# Patient Record
Sex: Male | Born: 1970
Health system: Southern US, Community
[De-identification: ages and names within clinical notes are randomized; demographics above are authoritative.]

## PROBLEM LIST (undated history)

## (undated) DIAGNOSIS — Z950 Presence of cardiac pacemaker: Secondary | ICD-10-CM

## (undated) DIAGNOSIS — I428 Other cardiomyopathies: Secondary | ICD-10-CM

## (undated) DIAGNOSIS — E785 Hyperlipidemia, unspecified: Secondary | ICD-10-CM

## (undated) DIAGNOSIS — K429 Umbilical hernia without obstruction or gangrene: Secondary | ICD-10-CM

## (undated) DIAGNOSIS — K922 Gastrointestinal hemorrhage, unspecified: Secondary | ICD-10-CM

## (undated) DIAGNOSIS — I10 Essential (primary) hypertension: Secondary | ICD-10-CM

## (undated) DIAGNOSIS — Z95810 Presence of automatic (implantable) cardiac defibrillator: Secondary | ICD-10-CM

## (undated) DIAGNOSIS — E119 Type 2 diabetes mellitus without complications: Secondary | ICD-10-CM

## (undated) HISTORY — PX: INSERT / REPLACE / REMOVE PACEMAKER: SUR710

## (undated) HISTORY — PX: CARDIAC SURGERY: SHX584

---

## 2008-02-08 ENCOUNTER — Inpatient Hospital Stay: Payer: Self-pay | Admitting: Internal Medicine

## 2008-02-08 ENCOUNTER — Other Ambulatory Visit: Payer: Self-pay

## 2008-02-23 ENCOUNTER — Ambulatory Visit: Payer: Self-pay | Admitting: Family

## 2008-03-22 ENCOUNTER — Ambulatory Visit: Payer: Self-pay | Admitting: Family

## 2008-04-25 ENCOUNTER — Ambulatory Visit: Payer: Self-pay | Admitting: Family

## 2008-06-28 ENCOUNTER — Ambulatory Visit: Payer: Self-pay | Admitting: Family

## 2008-11-09 ENCOUNTER — Ambulatory Visit: Payer: Self-pay | Admitting: Family

## 2009-03-15 ENCOUNTER — Ambulatory Visit: Payer: Self-pay | Admitting: Family

## 2009-06-28 ENCOUNTER — Ambulatory Visit: Payer: Self-pay | Admitting: Family

## 2009-10-04 ENCOUNTER — Ambulatory Visit: Payer: Self-pay | Admitting: Family

## 2011-09-03 DIAGNOSIS — I447 Left bundle-branch block, unspecified: Secondary | ICD-10-CM | POA: Insufficient documentation

## 2011-09-03 DIAGNOSIS — Z9289 Personal history of other medical treatment: Secondary | ICD-10-CM | POA: Insufficient documentation

## 2011-09-03 DIAGNOSIS — I420 Dilated cardiomyopathy: Secondary | ICD-10-CM | POA: Insufficient documentation

## 2012-03-03 DIAGNOSIS — Z950 Presence of cardiac pacemaker: Secondary | ICD-10-CM | POA: Insufficient documentation

## 2012-12-23 LAB — HEMOGLOBIN A1C: HEMOGLOBIN A1C: 6.9

## 2014-07-19 ENCOUNTER — Ambulatory Visit: Payer: Self-pay | Admitting: Family Medicine

## 2014-12-20 DIAGNOSIS — Z950 Presence of cardiac pacemaker: Secondary | ICD-10-CM | POA: Diagnosis not present

## 2015-02-07 DIAGNOSIS — E669 Obesity, unspecified: Secondary | ICD-10-CM | POA: Diagnosis not present

## 2015-02-07 DIAGNOSIS — I10 Essential (primary) hypertension: Secondary | ICD-10-CM | POA: Diagnosis not present

## 2015-02-07 DIAGNOSIS — I509 Heart failure, unspecified: Secondary | ICD-10-CM | POA: Diagnosis not present

## 2015-02-07 DIAGNOSIS — I429 Cardiomyopathy, unspecified: Secondary | ICD-10-CM | POA: Diagnosis not present

## 2015-02-23 DIAGNOSIS — I429 Cardiomyopathy, unspecified: Secondary | ICD-10-CM | POA: Diagnosis not present

## 2015-02-23 DIAGNOSIS — I509 Heart failure, unspecified: Secondary | ICD-10-CM | POA: Diagnosis not present

## 2015-08-28 DIAGNOSIS — I429 Cardiomyopathy, unspecified: Secondary | ICD-10-CM | POA: Diagnosis not present

## 2015-08-28 DIAGNOSIS — R0602 Shortness of breath: Secondary | ICD-10-CM | POA: Diagnosis not present

## 2015-08-28 DIAGNOSIS — I10 Essential (primary) hypertension: Secondary | ICD-10-CM | POA: Diagnosis not present

## 2015-08-28 DIAGNOSIS — E669 Obesity, unspecified: Secondary | ICD-10-CM | POA: Diagnosis not present

## 2015-08-28 DIAGNOSIS — R9431 Abnormal electrocardiogram [ECG] [EKG]: Secondary | ICD-10-CM | POA: Diagnosis not present

## 2015-08-28 DIAGNOSIS — Z950 Presence of cardiac pacemaker: Secondary | ICD-10-CM | POA: Diagnosis not present

## 2015-08-28 DIAGNOSIS — I5022 Chronic systolic (congestive) heart failure: Secondary | ICD-10-CM | POA: Diagnosis not present

## 2015-08-28 DIAGNOSIS — R42 Dizziness and giddiness: Secondary | ICD-10-CM | POA: Diagnosis not present

## 2016-02-26 DIAGNOSIS — R9431 Abnormal electrocardiogram [ECG] [EKG]: Secondary | ICD-10-CM | POA: Diagnosis not present

## 2016-02-26 DIAGNOSIS — I10 Essential (primary) hypertension: Secondary | ICD-10-CM | POA: Diagnosis not present

## 2016-02-26 DIAGNOSIS — I472 Ventricular tachycardia: Secondary | ICD-10-CM | POA: Diagnosis not present

## 2016-02-26 DIAGNOSIS — I509 Heart failure, unspecified: Secondary | ICD-10-CM | POA: Diagnosis not present

## 2016-02-26 DIAGNOSIS — I429 Cardiomyopathy, unspecified: Secondary | ICD-10-CM | POA: Diagnosis not present

## 2016-02-26 DIAGNOSIS — E669 Obesity, unspecified: Secondary | ICD-10-CM | POA: Diagnosis not present

## 2016-02-26 DIAGNOSIS — R0602 Shortness of breath: Secondary | ICD-10-CM | POA: Diagnosis not present

## 2016-02-26 DIAGNOSIS — I5022 Chronic systolic (congestive) heart failure: Secondary | ICD-10-CM | POA: Diagnosis not present

## 2016-02-26 DIAGNOSIS — R42 Dizziness and giddiness: Secondary | ICD-10-CM | POA: Diagnosis not present

## 2016-05-16 DIAGNOSIS — I429 Cardiomyopathy, unspecified: Secondary | ICD-10-CM | POA: Insufficient documentation

## 2016-05-16 DIAGNOSIS — E785 Hyperlipidemia, unspecified: Secondary | ICD-10-CM | POA: Insufficient documentation

## 2016-05-16 DIAGNOSIS — I10 Essential (primary) hypertension: Secondary | ICD-10-CM | POA: Insufficient documentation

## 2016-05-16 DIAGNOSIS — M109 Gout, unspecified: Secondary | ICD-10-CM | POA: Insufficient documentation

## 2016-05-16 DIAGNOSIS — E119 Type 2 diabetes mellitus without complications: Secondary | ICD-10-CM | POA: Insufficient documentation

## 2016-05-16 DIAGNOSIS — E669 Obesity, unspecified: Secondary | ICD-10-CM | POA: Insufficient documentation

## 2016-08-14 DIAGNOSIS — I5022 Chronic systolic (congestive) heart failure: Secondary | ICD-10-CM | POA: Diagnosis not present

## 2016-08-14 DIAGNOSIS — I10 Essential (primary) hypertension: Secondary | ICD-10-CM | POA: Diagnosis not present

## 2016-08-14 DIAGNOSIS — R42 Dizziness and giddiness: Secondary | ICD-10-CM | POA: Diagnosis not present

## 2016-08-14 DIAGNOSIS — R9431 Abnormal electrocardiogram [ECG] [EKG]: Secondary | ICD-10-CM | POA: Diagnosis not present

## 2016-08-14 DIAGNOSIS — I429 Cardiomyopathy, unspecified: Secondary | ICD-10-CM | POA: Diagnosis not present

## 2016-08-14 DIAGNOSIS — R0602 Shortness of breath: Secondary | ICD-10-CM | POA: Diagnosis not present

## 2016-08-14 DIAGNOSIS — I472 Ventricular tachycardia: Secondary | ICD-10-CM | POA: Diagnosis not present

## 2016-08-14 DIAGNOSIS — T82110A Breakdown (mechanical) of cardiac electrode, initial encounter: Secondary | ICD-10-CM | POA: Diagnosis not present

## 2016-08-14 DIAGNOSIS — E669 Obesity, unspecified: Secondary | ICD-10-CM | POA: Diagnosis not present

## 2016-08-15 DIAGNOSIS — I5022 Chronic systolic (congestive) heart failure: Secondary | ICD-10-CM | POA: Diagnosis not present

## 2016-11-26 DIAGNOSIS — I472 Ventricular tachycardia: Secondary | ICD-10-CM | POA: Diagnosis not present

## 2017-02-10 DIAGNOSIS — Z950 Presence of cardiac pacemaker: Secondary | ICD-10-CM | POA: Diagnosis not present

## 2017-02-10 DIAGNOSIS — E669 Obesity, unspecified: Secondary | ICD-10-CM | POA: Diagnosis not present

## 2017-02-10 DIAGNOSIS — R9431 Abnormal electrocardiogram [ECG] [EKG]: Secondary | ICD-10-CM | POA: Diagnosis not present

## 2017-02-10 DIAGNOSIS — I5022 Chronic systolic (congestive) heart failure: Secondary | ICD-10-CM | POA: Diagnosis not present

## 2017-02-10 DIAGNOSIS — I10 Essential (primary) hypertension: Secondary | ICD-10-CM | POA: Diagnosis not present

## 2017-02-10 DIAGNOSIS — T82110A Breakdown (mechanical) of cardiac electrode, initial encounter: Secondary | ICD-10-CM | POA: Diagnosis not present

## 2017-02-10 DIAGNOSIS — I447 Left bundle-branch block, unspecified: Secondary | ICD-10-CM | POA: Diagnosis not present

## 2017-02-10 DIAGNOSIS — I429 Cardiomyopathy, unspecified: Secondary | ICD-10-CM | POA: Diagnosis not present

## 2017-02-10 DIAGNOSIS — R42 Dizziness and giddiness: Secondary | ICD-10-CM | POA: Diagnosis not present

## 2017-02-10 DIAGNOSIS — I472 Ventricular tachycardia: Secondary | ICD-10-CM | POA: Diagnosis not present

## 2017-02-10 DIAGNOSIS — R0602 Shortness of breath: Secondary | ICD-10-CM | POA: Diagnosis not present

## 2017-03-11 DIAGNOSIS — I5022 Chronic systolic (congestive) heart failure: Secondary | ICD-10-CM | POA: Diagnosis not present

## 2017-03-11 DIAGNOSIS — I447 Left bundle-branch block, unspecified: Secondary | ICD-10-CM | POA: Diagnosis not present

## 2017-03-11 DIAGNOSIS — I429 Cardiomyopathy, unspecified: Secondary | ICD-10-CM | POA: Diagnosis not present

## 2017-03-11 DIAGNOSIS — Z95810 Presence of automatic (implantable) cardiac defibrillator: Secondary | ICD-10-CM | POA: Diagnosis not present

## 2017-03-11 DIAGNOSIS — R42 Dizziness and giddiness: Secondary | ICD-10-CM | POA: Diagnosis not present

## 2017-03-11 DIAGNOSIS — M1A00X0 Idiopathic chronic gout, unspecified site, without tophus (tophi): Secondary | ICD-10-CM | POA: Diagnosis not present

## 2017-03-11 DIAGNOSIS — I472 Ventricular tachycardia: Secondary | ICD-10-CM | POA: Diagnosis not present

## 2017-03-11 DIAGNOSIS — I10 Essential (primary) hypertension: Secondary | ICD-10-CM | POA: Diagnosis not present

## 2017-03-11 DIAGNOSIS — E669 Obesity, unspecified: Secondary | ICD-10-CM | POA: Diagnosis not present

## 2017-03-11 DIAGNOSIS — R0602 Shortness of breath: Secondary | ICD-10-CM | POA: Diagnosis not present

## 2017-03-11 DIAGNOSIS — R9431 Abnormal electrocardiogram [ECG] [EKG]: Secondary | ICD-10-CM | POA: Diagnosis not present

## 2017-03-11 DIAGNOSIS — Z950 Presence of cardiac pacemaker: Secondary | ICD-10-CM | POA: Diagnosis not present

## 2017-04-08 ENCOUNTER — Telehealth: Payer: Self-pay

## 2017-04-08 NOTE — Telephone Encounter (Signed)
Please review-aa 

## 2017-04-08 NOTE — Telephone Encounter (Signed)
[place] for 45 minute appointment if available

## 2017-04-08 NOTE — Telephone Encounter (Signed)
Please schedule.-aa 

## 2017-04-08 NOTE — Telephone Encounter (Signed)
Patient requesting to be seen tomorrow for a hernia lower abdominal area x's several years. Patient was last seen on 07/18/2014. Patient will be self pay. Please advise. CB# [phone]

## 2017-04-09 NOTE — Telephone Encounter (Signed)
Scheduled an appointment for 04/10/17@3 /MW

## 2017-04-10 ENCOUNTER — Encounter: Payer: Self-pay | Admitting: Family Medicine

## 2017-04-10 ENCOUNTER — Other Ambulatory Visit: Payer: Self-pay | Admitting: Family Medicine

## 2017-04-10 ENCOUNTER — Ambulatory Visit (INDEPENDENT_AMBULATORY_CARE_PROVIDER_SITE_OTHER): Payer: Medicare Other | Admitting: Family Medicine

## 2017-04-10 VITALS — BP 112/74 | HR 99 | Temp 98.3°F | Resp 17 | Ht 68.0 in | Wt 232.4 lb

## 2017-04-10 DIAGNOSIS — N50812 Left testicular pain: Secondary | ICD-10-CM | POA: Diagnosis not present

## 2017-04-10 DIAGNOSIS — I4729 Other ventricular tachycardia: Secondary | ICD-10-CM | POA: Insufficient documentation

## 2017-04-10 DIAGNOSIS — I5022 Chronic systolic (congestive) heart failure: Secondary | ICD-10-CM | POA: Insufficient documentation

## 2017-04-10 DIAGNOSIS — Z95810 Presence of automatic (implantable) cardiac defibrillator: Secondary | ICD-10-CM | POA: Insufficient documentation

## 2017-04-10 DIAGNOSIS — I472 Ventricular tachycardia: Secondary | ICD-10-CM | POA: Insufficient documentation

## 2017-04-10 LAB — POCT URINALYSIS DIPSTICK
Bilirubin, UA: NEGATIVE
GLUCOSE UA: NEGATIVE
Ketones, UA: NEGATIVE
Leukocytes, UA: NEGATIVE
Nitrite, UA: NEGATIVE
Spec Grav, UA: <=1.005 — AB (ref 1.010–1.025)
UROBILINOGEN UA: 4 U/dL — AB
pH, UA: 7 (ref 5.0–8.0)

## 2017-04-10 NOTE — Patient Instructions (Signed)
Discussed bathtub soaks in warm water for a few days.

## 2017-04-10 NOTE — Progress Notes (Signed)
Subjective:     Patient ID: Cody [name], male   DOB: 1971-08-19, 46 y.o.   MRN: [id number]  HPI  Chief Complaint  Patient presents with  . Establish Care    Patient returns to office today to re-esatablish patient care, patient states that he is not feeling well today and would like to address testicular swellin on the left side for the past 4 days.   States he had [place] the beach with his [place] when early Monday AM, 5/21, he developed acute left testicle pain. States it bothered him walking but has steadily improved and is no longer bothering him.Denies injury, dysuria,fever, chills. Reports no new sexual partners. Wife accompanies.   Review of Systems     Objective:   Physical Exam  Constitutional: He appears well-developed and well-nourished. No distress.  Genitourinary:  Genitourinary Comments: No testicle swelling, erythema, or tenderness. No epididymal tenderness. No hernia appreciated bilaterally.       Assessment:    1. Pain in left testicle: spontaneously resolved. - POCT urinalysis dipstick    Plan:    Discussed bathtub soaks. Consider urology referral if recurrent.

## 2017-05-09 ENCOUNTER — Encounter: Payer: Self-pay | Admitting: Family Medicine

## 2017-06-03 DIAGNOSIS — I472 Ventricular tachycardia: Secondary | ICD-10-CM | POA: Diagnosis not present

## 2017-09-10 DIAGNOSIS — E669 Obesity, unspecified: Secondary | ICD-10-CM | POA: Diagnosis not present

## 2017-09-10 DIAGNOSIS — Z950 Presence of cardiac pacemaker: Secondary | ICD-10-CM | POA: Diagnosis not present

## 2017-09-10 DIAGNOSIS — I428 Other cardiomyopathies: Secondary | ICD-10-CM | POA: Diagnosis not present

## 2017-09-10 DIAGNOSIS — Z6833 Body mass index (BMI) 33.0-33.9, adult: Secondary | ICD-10-CM | POA: Diagnosis not present

## 2017-09-10 DIAGNOSIS — I10 Essential (primary) hypertension: Secondary | ICD-10-CM | POA: Diagnosis not present

## 2017-09-10 DIAGNOSIS — I472 Ventricular tachycardia: Secondary | ICD-10-CM | POA: Diagnosis not present

## 2017-09-10 DIAGNOSIS — I447 Left bundle-branch block, unspecified: Secondary | ICD-10-CM | POA: Diagnosis not present

## 2017-09-10 DIAGNOSIS — M1A9XX1 Chronic gout, unspecified, with tophus (tophi): Secondary | ICD-10-CM | POA: Diagnosis not present

## 2017-09-10 DIAGNOSIS — I420 Dilated cardiomyopathy: Secondary | ICD-10-CM | POA: Diagnosis not present

## 2017-09-10 DIAGNOSIS — H938X3 Other specified disorders of ear, bilateral: Secondary | ICD-10-CM | POA: Diagnosis not present

## 2017-11-25 DIAGNOSIS — I5022 Chronic systolic (congestive) heart failure: Secondary | ICD-10-CM | POA: Diagnosis not present

## 2018-04-01 DIAGNOSIS — I10 Essential (primary) hypertension: Secondary | ICD-10-CM | POA: Diagnosis not present

## 2018-04-01 DIAGNOSIS — I420 Dilated cardiomyopathy: Secondary | ICD-10-CM | POA: Diagnosis not present

## 2018-04-01 DIAGNOSIS — I472 Ventricular tachycardia: Secondary | ICD-10-CM | POA: Diagnosis not present

## 2018-04-01 DIAGNOSIS — I429 Cardiomyopathy, unspecified: Secondary | ICD-10-CM | POA: Diagnosis not present

## 2018-04-01 DIAGNOSIS — R001 Bradycardia, unspecified: Secondary | ICD-10-CM | POA: Diagnosis not present

## 2018-04-01 DIAGNOSIS — Z95810 Presence of automatic (implantable) cardiac defibrillator: Secondary | ICD-10-CM | POA: Diagnosis not present

## 2018-04-01 DIAGNOSIS — I5022 Chronic systolic (congestive) heart failure: Secondary | ICD-10-CM | POA: Diagnosis not present

## 2018-04-01 DIAGNOSIS — H938X3 Other specified disorders of ear, bilateral: Secondary | ICD-10-CM | POA: Diagnosis not present

## 2018-04-01 DIAGNOSIS — R42 Dizziness and giddiness: Secondary | ICD-10-CM | POA: Diagnosis not present

## 2018-04-01 DIAGNOSIS — I447 Left bundle-branch block, unspecified: Secondary | ICD-10-CM | POA: Diagnosis not present

## 2018-05-29 ENCOUNTER — Emergency Department: Payer: Medicare Other

## 2018-05-29 ENCOUNTER — Other Ambulatory Visit: Payer: Self-pay

## 2018-05-29 ENCOUNTER — Encounter: Payer: Self-pay | Admitting: Emergency Medicine

## 2018-05-29 ENCOUNTER — Inpatient Hospital Stay
Admission: EM | Admit: 2018-05-29 | Discharge: 2018-05-30 | DRG: 153 | Disposition: A | Discharge status: Home / Self Care | Payer: Medicare Other | Attending: Internal Medicine | Admitting: Internal Medicine

## 2018-05-29 DIAGNOSIS — W19XXXA Unspecified fall, initial encounter: Secondary | ICD-10-CM | POA: Diagnosis not present

## 2018-05-29 DIAGNOSIS — J029 Acute pharyngitis, unspecified: Principal | ICD-10-CM | POA: Diagnosis present

## 2018-05-29 DIAGNOSIS — T501X5A Adverse effect of loop [high-ceiling] diuretics, initial encounter: Secondary | ICD-10-CM | POA: Diagnosis present

## 2018-05-29 DIAGNOSIS — I429 Cardiomyopathy, unspecified: Secondary | ICD-10-CM | POA: Diagnosis not present

## 2018-05-29 DIAGNOSIS — R1310 Dysphagia, unspecified: Secondary | ICD-10-CM | POA: Diagnosis present

## 2018-05-29 DIAGNOSIS — E7800 Pure hypercholesterolemia, unspecified: Secondary | ICD-10-CM | POA: Diagnosis present

## 2018-05-29 DIAGNOSIS — I110 Hypertensive heart disease with heart failure: Secondary | ICD-10-CM | POA: Diagnosis present

## 2018-05-29 DIAGNOSIS — Z7982 Long term (current) use of aspirin: Secondary | ICD-10-CM

## 2018-05-29 DIAGNOSIS — S199XXA Unspecified injury of neck, initial encounter: Secondary | ICD-10-CM | POA: Diagnosis not present

## 2018-05-29 DIAGNOSIS — E876 Hypokalemia: Secondary | ICD-10-CM | POA: Diagnosis present

## 2018-05-29 DIAGNOSIS — Z8249 Family history of ischemic heart disease and other diseases of the circulatory system: Secondary | ICD-10-CM | POA: Diagnosis not present

## 2018-05-29 DIAGNOSIS — I5022 Chronic systolic (congestive) heart failure: Secondary | ICD-10-CM | POA: Diagnosis present

## 2018-05-29 DIAGNOSIS — R490 Dysphonia: Secondary | ICD-10-CM | POA: Diagnosis not present

## 2018-05-29 DIAGNOSIS — R0602 Shortness of breath: Secondary | ICD-10-CM

## 2018-05-29 DIAGNOSIS — Z950 Presence of cardiac pacemaker: Secondary | ICD-10-CM | POA: Diagnosis not present

## 2018-05-29 DIAGNOSIS — I428 Other cardiomyopathies: Secondary | ICD-10-CM | POA: Diagnosis present

## 2018-05-29 DIAGNOSIS — E1165 Type 2 diabetes mellitus with hyperglycemia: Secondary | ICD-10-CM | POA: Diagnosis present

## 2018-05-29 DIAGNOSIS — R221 Localized swelling, mass and lump, neck: Secondary | ICD-10-CM | POA: Diagnosis not present

## 2018-05-29 DIAGNOSIS — R739 Hyperglycemia, unspecified: Secondary | ICD-10-CM | POA: Diagnosis not present

## 2018-05-29 DIAGNOSIS — Z79899 Other long term (current) drug therapy: Secondary | ICD-10-CM | POA: Diagnosis not present

## 2018-05-29 DIAGNOSIS — R070 Pain in throat: Secondary | ICD-10-CM | POA: Diagnosis not present

## 2018-05-29 HISTORY — DX: Hyperlipidemia, unspecified: E78.5

## 2018-05-29 HISTORY — DX: Other cardiomyopathies: I42.8

## 2018-05-29 HISTORY — DX: Presence of cardiac pacemaker: Z95.0

## 2018-05-29 HISTORY — DX: Presence of automatic (implantable) cardiac defibrillator: Z95.810

## 2018-05-29 HISTORY — DX: Essential (primary) hypertension: I10

## 2018-05-29 LAB — CBC WITH DIFFERENTIAL/PLATELET
BASOS ABS: 0.1 10*3/uL (ref 0–0.1)
BASOS PCT: 1 %
Eosinophils Absolute: 0.1 10*3/uL (ref 0–0.7)
Eosinophils Relative: 1 %
HEMATOCRIT: 44.5 % (ref 40.0–52.0)
HEMOGLOBIN: 16.3 g/dL (ref 13.0–18.0)
LYMPHS PCT: 28 %
Lymphs Abs: 2.4 10*3/uL (ref 1.0–3.6)
MCH: 34.6 pg — ABNORMAL HIGH (ref 26.0–34.0)
MCHC: 36.5 g/dL — ABNORMAL HIGH (ref 32.0–36.0)
MCV: 94.7 fL (ref 80.0–100.0)
Monocytes Absolute: 0.7 10*3/uL (ref 0.2–1.0)
Monocytes Relative: 9 %
NEUTROS ABS: 5 10*3/uL (ref 1.4–6.5)
Neutrophils Relative %: 61 %
Platelets: 172 10*3/uL (ref 150–440)
RBC: 4.7 MIL/uL (ref 4.40–5.90)
RDW: 12.6 % (ref 11.5–14.5)
WBC: 8.3 10*3/uL (ref 3.8–10.6)

## 2018-05-29 LAB — BASIC METABOLIC PANEL
ANION GAP: 16 — AB (ref 5–15)
BUN: 9 mg/dL (ref 6–20)
CHLORIDE: 88 mmol/L — AB (ref 98–111)
CO2: 30 mmol/L (ref 22–32)
Calcium: 8.8 mg/dL — ABNORMAL LOW (ref 8.9–10.3)
Creatinine, Ser: 0.73 mg/dL (ref 0.61–1.24)
GFR calc non Af Amer: >60 mL/min (ref >60)
Glucose, Bld: 338 mg/dL — ABNORMAL HIGH (ref 70–99)
Potassium: 2.6 mmol/L — CL (ref 3.5–5.1)
Sodium: 134 mmol/L — ABNORMAL LOW (ref 135–145)

## 2018-05-29 LAB — MAGNESIUM: Magnesium: 1.8 mg/dL (ref 1.7–2.4)

## 2018-05-29 LAB — BLOOD GAS, VENOUS
Acid-Base Excess: 5.3 mmol/L — ABNORMAL HIGH (ref 0.0–2.0)
BICARBONATE: 30.5 mmol/L — AB (ref 20.0–28.0)
O2 Saturation: 92.4 %
PCO2 VEN: 46 mmHg (ref 44.0–60.0)
PH VEN: 7.43 (ref 7.250–7.430)
PO2 VEN: 63 mmHg — AB (ref 32.0–45.0)
Patient temperature: 37

## 2018-05-29 LAB — HEMOGLOBIN A1C
Hgb A1c MFr Bld: 11.2 % — ABNORMAL HIGH (ref 4.8–5.6)
MEAN PLASMA GLUCOSE: 274.74 mg/dL

## 2018-05-29 LAB — TROPONIN I

## 2018-05-29 LAB — GLUCOSE, CAPILLARY
GLUCOSE-CAPILLARY: 351 mg/dL — AB (ref 70–99)
Glucose-Capillary: 285 mg/dL — ABNORMAL HIGH (ref 70–99)

## 2018-05-29 LAB — POTASSIUM: POTASSIUM: 4 mmol/L (ref 3.5–5.1)

## 2018-05-29 LAB — BRAIN NATRIURETIC PEPTIDE: B Natriuretic Peptide: 40 pg/mL (ref 0.0–100.0)

## 2018-05-29 LAB — GROUP A STREP BY PCR: Group A Strep by PCR: NOT DETECTED

## 2018-05-29 MED ORDER — LIDOCAINE VISCOUS HCL 2 % MT SOLN
15.0000 mL | OROMUCOSAL | Status: DC | PRN
  Administered 2018-05-29: 15 mL via OROMUCOSAL
  Filled 2018-05-29 (×2): qty 15

## 2018-05-29 MED ORDER — ACETAMINOPHEN 650 MG RE SUPP: 650.0000 mg | Freq: Four times a day (QID) | RECTAL | Status: DC | PRN

## 2018-05-29 MED ORDER — CARVEDILOL 6.25 MG PO TABS
3.1250 mg | ORAL_TABLET | Freq: Two times a day (BID) | ORAL | Status: DC
  Administered 2018-05-29 – 2018-05-30 (×2): 3.125 mg via ORAL
  Filled 2018-05-29 (×2): qty 1

## 2018-05-29 MED ORDER — DIGOXIN 250 MCG PO TABS
0.2500 mg | ORAL_TABLET | Freq: Every day | ORAL | Status: DC
  Administered 2018-05-30: 0.25 mg via ORAL
  Filled 2018-05-29: qty 1

## 2018-05-29 MED ORDER — IOHEXOL 300 MG/ML  SOLN
75.0000 mL | Freq: Once | INTRAMUSCULAR | Status: AC | PRN
  Administered 2018-05-29: 75 mL via INTRAVENOUS

## 2018-05-29 MED ORDER — LIDOCAINE VISCOUS HCL 2 % MT SOLN
15.0000 mL | Freq: Once | OROMUCOSAL | Status: AC
  Administered 2018-05-29: 15 mL via OROMUCOSAL
  Filled 2018-05-29: qty 15

## 2018-05-29 MED ORDER — SODIUM CHLORIDE 0.9 % IV SOLN
Freq: Once | INTRAVENOUS | Status: AC
  Administered 2018-05-29: 15:00:00 via INTRAVENOUS

## 2018-05-29 MED ORDER — DEXAMETHASONE SODIUM PHOSPHATE 10 MG/ML IJ SOLN
10.0000 mg | Freq: Four times a day (QID) | INTRAMUSCULAR | Status: DC
  Administered 2018-05-29 – 2018-05-30 (×4): 10 mg via INTRAVENOUS
  Filled 2018-05-29 (×5): qty 1

## 2018-05-29 MED ORDER — ACETAMINOPHEN 325 MG PO TABS: 650.0000 mg | ORAL_TABLET | Freq: Four times a day (QID) | ORAL | Status: DC | PRN

## 2018-05-29 MED ORDER — MORPHINE SULFATE (PF) 2 MG/ML IV SOLN
1.0000 mg | Freq: Four times a day (QID) | INTRAVENOUS | Status: DC | PRN
  Administered 2018-05-29 (×2): 1 mg via INTRAVENOUS
  Filled 2018-05-29: qty 1

## 2018-05-29 MED ORDER — SODIUM CHLORIDE 0.9 % IV SOLN
3.0000 g | Freq: Four times a day (QID) | INTRAVENOUS | Status: DC
  Administered 2018-05-29 – 2018-05-30 (×4): 3 g via INTRAVENOUS
  Filled 2018-05-29 (×6): qty 3

## 2018-05-29 MED ORDER — DEXAMETHASONE SODIUM PHOSPHATE 10 MG/ML IJ SOLN
10.0000 mg | Freq: Once | INTRAMUSCULAR | Status: AC
  Administered 2018-05-29: 10 mg via INTRAVENOUS
  Filled 2018-05-29: qty 1

## 2018-05-29 MED ORDER — ALBUTEROL SULFATE (2.5 MG/3ML) 0.083% IN NEBU
2.5000 mg | INHALATION_SOLUTION | RESPIRATORY_TRACT | Status: DC | PRN
Start: 2018-05-29 — End: 2018-05-30

## 2018-05-29 MED ORDER — POTASSIUM CHLORIDE CRYS ER 20 MEQ PO TBCR
40.0000 meq | EXTENDED_RELEASE_TABLET | Freq: Once | ORAL | Status: AC
  Administered 2018-05-29: 40 meq via ORAL
  Filled 2018-05-29: qty 2

## 2018-05-29 MED ORDER — ENOXAPARIN SODIUM 40 MG/0.4ML ~~LOC~~ SOLN
40.0000 mg | SUBCUTANEOUS | Status: DC
  Administered 2018-05-29: 40 mg via SUBCUTANEOUS
  Filled 2018-05-29: qty 0.4

## 2018-05-29 MED ORDER — POLYETHYLENE GLYCOL 3350 17 G PO PACK: 17.0000 g | PACK | Freq: Every day | ORAL | Status: DC | PRN

## 2018-05-29 MED ORDER — SODIUM CHLORIDE 0.9 % IV SOLN
1000.0000 mL | Freq: Once | INTRAVENOUS | Status: AC
  Administered 2018-05-29: 1000 mL via INTRAVENOUS

## 2018-05-29 MED ORDER — ASPIRIN EC 81 MG PO TBEC
81.0000 mg | DELAYED_RELEASE_TABLET | Freq: Every day | ORAL | Status: DC
Start: 2018-05-29 — End: 2018-05-30
  Administered 2018-05-29: 81 mg via ORAL
  Filled 2018-05-29 (×2): qty 1

## 2018-05-29 MED ORDER — ALLOPURINOL 300 MG PO TABS
300.0000 mg | ORAL_TABLET | Freq: Every day | ORAL | Status: DC
  Administered 2018-05-29 – 2018-05-30 (×2): 300 mg via ORAL
  Filled 2018-05-29: qty 3
  Filled 2018-05-29 (×2): qty 1

## 2018-05-29 MED ORDER — MORPHINE SULFATE (PF) 2 MG/ML IV SOLN
INTRAVENOUS | Status: AC
  Administered 2018-05-29: 1 mg via INTRAVENOUS
  Filled 2018-05-29: qty 1

## 2018-05-29 MED ORDER — POTASSIUM CHLORIDE 10 MEQ/100ML IV SOLN
10.0000 meq | INTRAVENOUS | Status: AC
  Administered 2018-05-29 (×4): 10 meq via INTRAVENOUS
  Filled 2018-05-29 (×6): qty 100

## 2018-05-29 MED ORDER — INSULIN ASPART 100 UNIT/ML ~~LOC~~ SOLN
0.0000 [IU] | Freq: Three times a day (TID) | SUBCUTANEOUS | Status: DC
  Administered 2018-05-29: 5 [IU] via SUBCUTANEOUS
  Administered 2018-05-30: 9 [IU] via SUBCUTANEOUS
  Filled 2018-05-29 (×2): qty 1

## 2018-05-29 MED ORDER — SODIUM CHLORIDE 0.9 % IV SOLN
INTRAVENOUS | Status: DC
  Administered 2018-05-29: 16:00:00 via INTRAVENOUS

## 2018-05-29 MED ORDER — FUROSEMIDE 40 MG PO TABS
40.0000 mg | ORAL_TABLET | Freq: Two times a day (BID) | ORAL | Status: DC
  Administered 2018-05-29 – 2018-05-30 (×2): 40 mg via ORAL
  Filled 2018-05-29 (×2): qty 1

## 2018-05-29 MED ORDER — SODIUM CHLORIDE 0.9 % IV SOLN
3.0000 g | Freq: Once | INTRAVENOUS | Status: AC
  Administered 2018-05-29: 3 g via INTRAVENOUS
  Filled 2018-05-29: qty 3

## 2018-05-29 MED ORDER — SACUBITRIL-VALSARTAN 24-26 MG PO TABS
1.0000 | ORAL_TABLET | Freq: Two times a day (BID) | ORAL | Status: DC
  Administered 2018-05-29 – 2018-05-30 (×2): 1 via ORAL
  Filled 2018-05-29 (×3): qty 1

## 2018-05-29 NOTE — ED Notes (Signed)
Pt reports he woke up this am with [facility] and painful throat. Pt's SO at bedside and states she heard a thump and is not sure if when pt got up he stumbled and may have fallen and hit throat on bedside table. Red mark noted medially below neck. Pt not speaking normally per pt, he presents with a raspy voice, wheezing noted audibly. Pt states hx of CHF and has dual pacemaker. Pt states he may have gained weight recently however is unsure. EDP notified.

## 2018-05-29 NOTE — ED Notes (Signed)
Date and time results received: 05/29/18 0742  Test: Potassium Critical Value: 2.6  Name of Provider Notified: [facility] Orders Received? Or Actions Taken?: no further orders given at this time, will continue to monitor pt

## 2018-05-29 NOTE — ED Notes (Signed)
Pt back from CT at this time 

## 2018-05-29 NOTE — H&P (Signed)
[place] [facility] [place] NAME: Cody [name]    MR#:  [id number]  DATE OF BIRTH:  [birth year]  DATE OF ADMISSION:  05/29/2018  PRIMARY CARE PHYSICIAN: Carmon Ginsberg, PA   REQUESTING/REFERRING PHYSICIAN: dr Corky Downs  CHIEF COMPLAINT:  difficulty swallowing and pain along with some shortness of breath starting this morning  HISTORY OF PRESENT ILLNESS:  Cody [name]  is a 47 y.o. male with a known history of non-ischemic cardiomyopathy with severe systolic dysfunction EF 00% status post pacemaker/AICD placement, hypertension, hypercholesterolemia comes to the emergency room after he woke up in the middle of the night feeling sore throat. Patient went back to back bed woke up feeling very short of breath and had severe pain while swallowing. Came to the emergency room and CT scan showed inflammation swelling of the piriform sinus and arytenoid folds. ENT Dr Ladene Artist evaluated patient and on the warrant laryngoscope and findings were consistent with CT findings. Patient received IV unison and IV Decadron. His stats are 96% on room air. His blood pressure stable. He still has a lot of salivation  pt is being admitted with acute pharyngitis.  PAST MEDICAL HISTORY:  History reviewed. No pertinent past medical history.  PAST SURGICAL HISTOIRY:   Past Surgical History:  Procedure Laterality Date  . CARDIAC SURGERY     pacemaker placed in 2010, in 2014 batteries were replased    SOCIAL HISTORY:   Social History   Tobacco Use  . Smoking status: Never Smoker  . Smokeless tobacco: Never Used  Substance Use Topics  . Alcohol use: Yes    Comment: occasional    FAMILY HISTORY:   Family History  Problem Relation Age of Onset  . Heart disease Mother   . Cirrhosis Maternal Grandfather   . Alzheimer's disease Paternal Grandmother   . Cirrhosis Paternal Grandfather   . Heart disease Maternal Grandmother     DRUG ALLERGIES:  No Known  Allergies  REVIEW OF SYSTEMS:  Review of Systems  Constitutional: Negative for chills, fever and weight loss.  HENT: Negative for ear discharge, ear pain and nosebleeds.   Eyes: Negative for blurred vision, pain and discharge.  Respiratory: Negative for sputum production, shortness of breath, wheezing and stridor.   Cardiovascular: Negative for chest pain, palpitations, orthopnea and PND.  Gastrointestinal: Negative for abdominal pain, diarrhea, nausea and vomiting.  Genitourinary: Negative for frequency and urgency.  Musculoskeletal: Negative for back pain and joint pain.  Neurological: Negative for sensory change, speech change, focal weakness and weakness.  Psychiatric/Behavioral: Negative for depression and hallucinations. The patient is not nervous/anxious.      MEDICATIONS AT HOME:   Prior to Admission medications   Medication Sig Start Date End Date Taking? Authorizing Provider  allopurinol (ZYLOPRIM) 100 MG tablet Take 300 mg by mouth daily.    Yes [provider]  aspirin 81 MG tablet Take 81 mg by mouth daily.    Yes [provider]  carvedilol (COREG) 3.125 MG tablet Take 3.125 mg by mouth 2 (two) times daily with a meal.    Yes [provider]  digoxin (LANOXIN) 0.25 MG tablet Take 0.25 mg by mouth daily.   Yes [provider]  ENTRESTO 24-26 MG Take 1 tablet by mouth 2 (two) times daily.  02/13/17  Yes [provider]  furosemide (LASIX) 20 MG tablet Take 40 mg by mouth 2 (two) times daily.    Yes [provider]  VITAL SIGNS:  Blood pressure 139/82, pulse 76, temperature 98.3 F (36.8 C), temperature source Oral, resp. rate 11, height 5\' 8"  (1.727 m), weight 96.2 kg (212 lb), SpO2 96 %.  PHYSICAL EXAMINATION:  GENERAL:  47 y.o.-year-old patient lying in the bed with no acute distress.  EYES: Pupils equal, round, reactive to light and accommodation. No scleral icterus. Extraocular muscles intact.  HEENT: Head  atraumatic, normocephalic. Oropharynx and nasopharynx clear. Increase salivation. Very limited exam since patient not able to open the mouth much NECK:  Supple, no jugular venous distention. No thyroid enlargement, no tenderness.  LUNGS: Normal breath sounds bilaterally, no wheezing, rales,rhonchi or crepitation. No use of accessory muscles of respiration.  CARDIOVASCULAR: S1, S2 normal. No murmurs, rubs, or gallops.  ABDOMEN: Soft, nontender, nondistended. Bowel sounds present. No organomegaly or mass.  EXTREMITIES: No pedal edema, cyanosis, or clubbing.  NEUROLOGIC: Cranial nerves II through XII are intact. Muscle strength 5/5 in all extremities. Sensation intact. Gait not checked.  PSYCHIATRIC: The patient is alert and oriented x 3.  SKIN: No obvious rash, lesion, or ulcer.   LABORATORY PANEL:   CBC Recent Labs  Lab 05/29/18 0649  WBC 8.3  HGB 16.3  HCT 44.5  PLT 172   ------------------------------------------------------------------------------------------------------------------  Chemistries  Recent Labs  Lab 05/29/18 0649  NA 134*  K 2.6*  CL 88*  CO2 30  GLUCOSE 338*  BUN 9  CREATININE 0.73  CALCIUM 8.8*   ------------------------------------------------------------------------------------------------------------------  Cardiac Enzymes Recent Labs  Lab 05/29/18 0649  TROPONINI <0.03   ------------------------------------------------------------------------------------------------------------------  RADIOLOGY:  Dg Neck Soft Tissue  Result Date: 05/29/2018 CLINICAL DATA:  Acute shortness of breath sore throat.  Wheezing. EXAM: NECK SOFT TISSUES - 1+ VIEW COMPARISON:  None. FINDINGS: There is no evidence of retropharyngeal soft tissue swelling or epiglottic enlargement. The cervical airway is unremarkable and no radio-opaque foreign body identified. Minimal calcification at the left carotid bifurcation. IMPRESSION: No significant. Electronically Signed   By:  Lorriane Shire M.D.   On: 05/29/2018 07:47   Dg Chest 2 View  Result Date: 05/29/2018 CLINICAL DATA:  Short of breath EXAM: CHEST - 2 VIEW COMPARISON:  02/08/2008 FINDINGS: LEFT-sided pacemaker with continuous leads overlies normal cardiac silhouette. Lungs are clear. Normal pulmonary vasculature. No effusion, infiltrate pneumothorax. No acute osseous abnormality. IMPRESSION: No acute cardiopulmonary process. Electronically Signed   By: Suzy Bouchard M.D.   On: 05/29/2018 07:52   Ct Soft Tissue Neck W Contrast  Result Date: 05/29/2018 CLINICAL DATA:  47 year old male with throat pain, shortness of breath, and abnormal voice. Unwitnessed fall, query throat injury. EXAM: CT NECK WITH CONTRAST TECHNIQUE: Multidetector CT imaging of the neck was performed using the standard protocol following the bolus administration of intravenous contrast. CONTRAST:  24mL OMNIPAQUE IOHEXOL 300 MG/ML  SOLN COMPARISON:  Neck radiographs 0713 hours today. FINDINGS: Pharynx and larynx: The nasopharynx and oropharynx soft tissue contours are within normal limits. There are mild postinflammatory dystrophic calcifications of the right palatine tonsil. The tongue base and epiglottis are normal. At the hypopharynx there is left greater than right bulky soft tissue swelling and/or edema of the aryepiglottic folds (series 2, image 53). Bulky soft tissue thickening in the bilateral piriform sinuses greater on the left (series 2, image 57), although no hyperenhancement or discrete hyperenhancing mass. The hypopharynx is gas distended above the level of soft tissue thickening. The base of the epiglottis and anterior commissure appear to remain normal. The glottis is closed, with no other laryngeal soft tissue  abnormality identified. The laryngeal cartilages appear normal. The subglottic trachea is normal. The retropharyngeal space is normal. The parapharyngeal spaces are normal. The cervical esophagus appears to remain normal caudal to  the soft tissue thickening. Salivary glands: Negative sublingual space. Bilateral submandibular and parotid glands are symmetric and normal. Thyroid: Negative. Lymph nodes: No cervical lymphadenopathy. No soft tissue inflammation identified. Vascular: The major vascular structures in the neck and at the skull base are patent with mild carotid atherosclerosis. Limited intracranial: Negative. Visualized orbits: Negative. Mastoids and visualized paranasal sinuses: Clear. Skeleton: Occasional carious dentition. Lower cervical spine mild disc and endplate degeneration. No acute osseous abnormality identified. Upper chest: Left chest cardiac pacemaker device with subclavian approach leads. No superior mediastinal lymphadenopathy. Normal lung apices. IMPRESSION: 1. Bulky soft tissue mass or swelling at both aryepiglottic folds and piriform sinuses, worse on the left. The epiglottis and remaining larynx appear normal. The soft tissue enlargement is heterogeneous, but without discrete hyperenhancing mass. No lymphadenopathy. The other deep soft tissue spaces of the neck are normal. Direct visualization by ENT is recommended to further characterize. At this point a non-neoplastic process is suspected, with noninfectious inflammation favored. If the patient is on an Ace-inhibitor then Angioedema is most likely. 2. Normal CT appearance of the neck elsewhere. Negative visible upper chest. Electronically Signed   By: Genevie Ann M.D.   On: 05/29/2018 09:46    EKG:    IMPRESSION AND PLAN:   Cody [name]  is a 47 y.o. male with a known history of non-ischemic cardiomyopathy with severe systolic dysfunction EF 50% status post pacemaker/AICD placement, hypertension, hypercholesterolemia comes to the emergency room after he woke up in the middle of the night feeling sore throat. Patient went back to back bed woke up feeling very short of breath and had severe pain while swallowing  1. acute pharyngitis/severe Odynophagia -CT  scan soft neck tissue showedBulky soft tissue mass or swelling at both aryepiglottic folds and piriform sinuses, worse on the left.The epiglottis and remaining larynx appear normal. The soft tissue enlargement is heterogeneous lymphadenopathy -admit to medical floor -IV unasyn IV Decadron -PRN lidocaine spray -she will be NPO till his swelling improves  2. non-ischemic cardiomyopathy EF 25% status post AICD -continue Lasix, entresto cardiac meds  3. Uncontrolled diabetes -patient not on any diabetic medications. He was told several years ago he has borderline high sugars. He never followed up with his primary care regarding sugars. -Place them on sliding scale insulin, check A1c -sugars are in the 300s I'm sure. Patient is diabetic. Consider starting oral agents while in house have him follow up as outpatient  4. DVT prophylaxis subcu Lovenox  Above discussed with patient and wife  All the records are reviewed and case discussed with ED provider. Management plans discussed with the patient, family and they are in agreement.  CODE STATUS: full  TOTAL TIME TAKING CARE OF THIS PATIENT: *50* minutes.    Fritzi Mandes M.D on 05/29/2018 at 1:18 PM  Between 7am to 6pm - Pager - [phone]  After 6pm go to [email or web address] - password [email or web address] [facility]  [facility]  Office  [phone]  CC: Primary care physician; Carmon Ginsberg, [place]

## 2018-05-29 NOTE — Op Note (Signed)
05/29/2018  12:44 PM    [name], Cody Docker  [id number]   Pre-Op Dx: Hypopharyngeal swelling and pain that is acute in nature  Post-op Dx: Swelling involving the piriform sinuses and arytenoids but not the laryngeal inlet.   Proc: Flexible laryngoscopy  Surg:  Cody [name]  Anes: Topical  EBL: None  Comp: None  Findings: Swelling involving the piriform sinuses on both sides more on the left.  The arytenoids of swelling as well.  This is reddish swelling and not typical of bruising nor of angioedema.  The cords seem to move fairly well although is quite hoarse and they are slightly pink.  There is no swelling of the cords.  There is no exudate or ulcerations seen anywhere.   Procedure: The patient was seen in the emergency room.  His nose is sprayed with 4% Xylocaine mixed with Afrin for topical anesthesia.  The flexible scope was used to visualize both sides of his nose and his septum deviates to his left somewhat.  Scope was passed to the right nostril to visualize the hypopharynx and larynx.  The nose and nasopharynx are clear.  Posterior pharynx shows no swelling the posterior wall and the tongue base looks normal.  The epiglottis is not swollen at all in the vallecula is clear.  The piriform sinuses show swelling of the mucosa that is reddened and lifted up to fill the entire piriform sinus on both sides.  There is no evidence of bruising here.  No exudate or purulence noted.  There is redness of both arytenoids that are swollen but the true cords are not swollen.  They are slightly pink but seem to move pretty well.  His airway is open and he can breathe okay.  Dispo:   He will be followed by the emergency room doctor until he can be admitted for observation overnight  Plan: Will observe and treat with Decadron and Augmentin as this appears to be infection.  A full consultation has been dictated.  Cody [name]  05/29/2018 12:44 PM

## 2018-05-29 NOTE — ED Triage Notes (Signed)
Pt reports SOB starting about 2 this am and a sore throat. Pt reports hx of CHF as well. Pt with audible wheezing in triage.

## 2018-05-29 NOTE — ED Provider Notes (Addendum)
[facility] Emergency Department Provider Note   ____________________________________________    I have reviewed the triage vital signs and the nursing notes.   HISTORY  Chief Complaint Shortness of Breath and Sore Throat     HPI Cody [name] is a 47 y.o. male who presents with complaints of shortness of breath.  Patient has a history of CHF and states that he feels congested in his chest similar to when he had CHF however he also complains of a sore throat today.  Patient reports he woke up at 2 AM and somehow he had fallen onto the floor, he had a sore throat and felt short of breath at that time.  Denies fevers or chills.  Felt well prior to bed.  Denies chest pain.  No rib injuries.   History reviewed. No pertinent past medical history.  Patient Active Problem List   Diagnosis Date Noted  . Chronic systolic heart failure ([place]) 04/10/2017  . NSVT (nonsustained ventricular tachycardia) ([place]) 04/10/2017  . AICD (automatic cardioverter/defibrillator) present 04/10/2017  . Well controlled diabetes mellitus ([place]) 05/16/2016  . Gout 05/16/2016  . HLD (hyperlipidemia) 05/16/2016  . Benign hypertension 05/16/2016  . Cardiomyopathy ([place]) 05/16/2016  . Adiposity 05/16/2016  . Cardiac pacemaker in situ 03/03/2012  . Left bundle branch block 09/03/2011  . Nonischemic dilated cardiomyopathy ([place]) 09/03/2011    Past Surgical History:  Procedure Laterality Date  . CARDIAC SURGERY     pacemaker placed in 2010, in 2014 batteries were replased    Prior to Admission medications   Medication Sig Start Date End Date Taking? Authorizing Provider  allopurinol (ZYLOPRIM) 100 MG tablet Take 300 mg by mouth daily.    Yes [provider]  aspirin 81 MG tablet Take 81 mg by mouth daily.    Yes [provider]  carvedilol (COREG) 3.125 MG tablet Take 3.125 mg by mouth 2 (two) times daily with a meal.    Yes [provider]  digoxin  (LANOXIN) 0.25 MG tablet Take 0.25 mg by mouth daily.   Yes [provider]  ENTRESTO 24-26 MG Take 1 tablet by mouth 2 (two) times daily.  02/13/17  Yes [provider]  furosemide (LASIX) 20 MG tablet Take 40 mg by mouth 2 (two) times daily.    Yes [provider]     Allergies Patient has no known allergies.  Family History  Problem Relation Age of Onset  . Heart disease Mother   . Cirrhosis Maternal Grandfather   . Alzheimer's disease Paternal Grandmother   . Cirrhosis Paternal Grandfather   . Heart disease Maternal Grandmother     Social History Social History   Tobacco Use  . Smoking status: Never Smoker  . Smokeless tobacco: Never Used  Substance Use Topics  . Alcohol use: Yes    Comment: occasional  . Drug use: No    Review of Systems Constitutional: No fever/chills Eyes: No visual changes.  ENT: As above Cardiovascular: Denies chest pain. Respiratory: As above Gastrointestinal: No abdominal pain.  No nausea, no vomiting.   Genitourinary: Negative for dysuria. Musculoskeletal: Negative for back pain. Skin: Negative for abrasion or laceration Neurological: Negative for headaches   ____________________________________________   PHYSICAL EXAM:  VITAL SIGNS: ED Triage Vitals  Enc Vitals Group     BP 05/29/18 0643 119/69     Pulse Rate 05/29/18 0643 88     Resp 05/29/18 0643 16     Temp 05/29/18 0643 98.3 F (  36.8 C)     Temp Source 05/29/18 0643 Oral     SpO2 05/29/18 0643 95 %     Weight 05/29/18 0640 96.2 kg (212 lb)     Height 05/29/18 0640 1.727 m (5\' 8" )     Head Circumference --      Peak Flow --      Pain Score 05/29/18 0640 7     Pain Loc --      Pain Edu? --      Excl. in [place]? --     Constitutional: Alert and oriented. No acute distress.  Eyes: Conjunctivae are normal.   Nose: No congestion/rhinnorhea. Mouth/Throat: Mucous membranes are moist.    Cardiovascular: Normal rate, regular rhythm. Grossly normal  heart sounds.  Good peripheral circulation. Respiratory: Normal respiratory effort.  No retractions.  Gastrointestinal: Soft and nontender. No distention.  .  Musculoskeletal:  Warm and well perfused Neurologic:  Normal speech and language. No gross focal neurologic deficits are appreciated.  Skin:  Skin is warm, dry and intact. No rash noted. Psychiatric: Mood and affect are normal. Speech and behavior are normal.  ____________________________________________   LABS (all labs ordered are listed, but only abnormal results are displayed)  Labs Reviewed  CBC WITH DIFFERENTIAL/PLATELET - Abnormal; Notable for the following components:      Result Value   MCH 34.6 (*)    MCHC 36.5 (*)    All other components within normal limits  BASIC METABOLIC PANEL - Abnormal; Notable for the following components:   Sodium 134 (*)    Potassium 2.6 (*)    Chloride 88 (*)    Glucose, Bld 338 (*)    Calcium 8.8 (*)    Anion gap 16 (*)    All other components within normal limits  BLOOD GAS, VENOUS - Abnormal; Notable for the following components:   pO2, Ven 63.0 (*)    Bicarbonate 30.5 (*)    Acid-Base Excess 5.3 (*)    All other components within normal limits  GROUP A STREP BY PCR  BRAIN NATRIURETIC PEPTIDE  TROPONIN I   ____________________________________________  EKG  ED ECG REPORT I, Lavonia Drafts, the attending physician, personally viewed and interpreted this ECG.  Date: 05/29/2018  Rhythm: Atrial sensed, ventricular paced QRS Axis: Abnormal Intervals: Abnormal ST/T Wave abnormalities: normal   ____________________________________________  RADIOLOGY  CT scan demonstrates swelling around the epiglottis ____________________________________________   PROCEDURES  Procedure(s) performed: No  Procedures   Critical Care performed: yes  CRITICAL CARE Performed by: Lavonia Drafts   Total critical care time: 30 minutes  Critical care time was exclusive of separately  billable procedures and treating other patients.  Critical care was necessary to treat or prevent imminent or life-threatening deterioration.  Critical care was time spent personally by me on the following activities: development of treatment plan with patient and/or surrogate as well as nursing, discussions with consultants, evaluation of patient's response to treatment, examination of patient, obtaining history from patient or surrogate, ordering and performing treatments and interventions, ordering and review of laboratory studies, ordering and review of radiographic studies, pulse oximetry and re-evaluation of patient's condition.  ____________________________________________   INITIAL IMPRESSION / ASSESSMENT AND PLAN / ED COURSE  Pertinent labs & imaging results that were available during my care of the patient were reviewed by me and considered in my medical decision making (see chart for details).  Patient presents with shortness of breath and a sore throat which he describes as mild to moderate.  No stridor on exam.  No fevers or chills.  Will check labs, x-rays including lateral soft tissue neck, 2 view chest x-ray and reevaluate  Soft tissue neck x-ray is unremarkable, x-ray of the chest is unremarkable.  Patient still complains of shortness of breath and sore throat will obtain CT neck  ----------------------------------------- 10:05 AM on 05/29/2018 -----------------------------------------  CT neck demonstrates bulky soft tissue mass or swelling at both area epiglottic folds and piriform sinuses, have paged ENT  ----------------------------------------- 12:31 PM on 05/29/2018 -----------------------------------------  Dr. Kathyrn Sheriff has scoped patient, he feels this is likely infectious, recommends Unasyn and Decadron and admission to a floor bed    ____________________________________________   FINAL CLINICAL IMPRESSION(S) / ED DIAGNOSES  Final diagnoses:  Pharyngitis,  unspecified etiology  Shortness of breath        Note:  This document was prepared using Dragon voice recognition software and may include unintentional dictation errors.    Lavonia Drafts, MD 05/29/18 [place]    Lavonia Drafts, MD 06/10/18 1041

## 2018-05-29 NOTE — ED Notes (Signed)
Report given to Liz, RN

## 2018-05-29 NOTE — Consult Note (Signed)
Cody [name], Cody [name] [id number] August 04, 1971 Lavonia Drafts, MD   Reason for Consult: Evaluate throat because of pain and hoarseness and shortness of breath that started acutely 10 hours ago.  HPI: The patient is a 47 year old white male who was well yesterday and went to sleep.  He awoke at 2 AM on the floor.  His wife heard him hit the floor and wondered what had happened.  He complained of pain in his throat and felt slightly short of breath.  He presented the emergency room is been watched.  His symptoms have not worsened but they have not necessarily gotten any better.  A plain film of his neck did not show any problems but a CT scan of his neck to shown some swelling around his arytenoids and in his piriform sinuses.  ENT is called for evaluation of his hypopharynx and larynx to try to assess the etiology of this.  He has not had a lot of sore throat problems in the past never had anything like this before.  He does not remember having any trauma to his neck or feel any soreness on the outside of his neck.  He does not have any significant heartburn or evidence of reflux into his throat.  He has not had a cold or other fevers or signs of infection and is upper respiratory tract.  Allergies: No Known Allergies  ROS: Review of systems normal other than 12 systems except per HPI.  PMH: History reviewed. No pertinent past medical history.  FH:  Family History  Problem Relation Age of Onset  . Heart disease Mother   . Cirrhosis Maternal Grandfather   . Alzheimer's disease Paternal Grandmother   . Cirrhosis Paternal Grandfather   . Heart disease Maternal Grandmother     SH:  Social History   Socioeconomic History  . Marital status: Married    Spouse name: Not on file  . Number of children: Not on file  . Years of education: Not on file  . Highest education level: Not on file  Occupational History  . Not on file  Social Needs  . Financial resource strain: Not on file  . Food insecurity:     Worry: Not on file    Inability: Not on file  . Transportation needs:    Medical: Not on file    Non-medical: Not on file  Tobacco Use  . Smoking status: Never Smoker  . Smokeless tobacco: Never Used  Substance and Sexual Activity  . Alcohol use: Yes    Comment: occasional  . Drug use: No  . Sexual activity: Yes    Partners: Female    Birth control/protection: None  Lifestyle  . Physical activity:    Days per week: Not on file    Minutes per session: Not on file  . Stress: Not on file  Relationships  . Social connections:    Talks on phone: Not on file    Gets together: Not on file    Attends religious service: Not on file    Active member of club or organization: Not on file    Attends meetings of clubs or organizations: Not on file    Relationship status: Not on file  . Intimate partner violence:    Fear of current or ex partner: Not on file    Emotionally abused: Not on file    Physically abused: Not on file    Forced sexual activity: Not on file  Other Topics Concern  . Not on file  Social History Narrative  . Not on file    PSH:  Past Surgical History:  Procedure Laterality Date  . CARDIAC SURGERY     pacemaker placed in 2010, in 2014 batteries were replased    Physical  Exam: Well-developed well-nourished white male in mild distress, especially when he tries to swallow.  His voice is very  hoarse and almost breathy.  He is not have any stridor currently.  CN 2-12 grossly intact and symmetric.  Oral cavity, lips, gums, ororpharynx normal with no masses or lesions. Skin warm and dry. Nasal cavity without polyps or purulence. External nose and ears without masses or lesions. Neck supple with no masses or lesions.  He is tender in his anterior neck some especially if you put pressure on his neck.  No lymphadenopathy palpated. Thyroid normal with no masses.  Flexible laryngoscopy is done through his right nostril which is more open.  This is dictated in detail  elsewhere.  The nose and nasopharynx are totally clear.  The hypopharynx was normal epiglottis and vocal cords seem to move well.  There is slightly pink and.  The subglottic space appears clear.  His arytenoids have redness on both sides and there is swelling in the piriform sinuses and edema that pretty much feels the sinuses.  This is not purpleish like bruising and it is not watery edema like an allergic reaction.  It appears more red like infection but there is no purulence anywhere.  This is not involving his aryepiglottic folds or epiglottis at all.  The laryngeal inlet is not swollen nor are the vocal cords.  I reviewed his CT scan in detail which shows the swelling in the piriform sinus more so on his left side than his right.  The laryngeal inlet is clear and there is no evidence of swelling at the cords.  He does not show any trauma to his laryngeal cartilage rest of his neck appears to be fairly normal..   A/P: He has swelling in his piriform sinuses and around the arytenoids which appears to be more infectious etiology then bruising or allergy.  Feel that Decadron would be appropriate to help decrease the swelling and help him feel little bit better will use Augmentin to help control infection that may the be in the area, potentially starting in the left piriform sinus as noted on CT scan.  The patient did not eat anything unusual the night before that may have caused the problem here.  Because of the swelling around the larynx is probably most appropriate to observe him overnight and make sure his swallowing and voice is better in the morning.  If so he can be discharged home on a prednisone taper and continued Augmentin antibiotics.  He can be evaluated further in a week or so in the office to make sure that all the swelling is completely resolved.  He can return sooner if there are any challenges.   Cody [name] 05/29/2018 12:31 PM

## 2018-05-29 NOTE — ED Notes (Signed)
Rounded on pt. Wife at bedside. Denies needs at this time. Will continue to monitor.

## 2018-05-29 NOTE — ED Notes (Signed)
Patient transported to X-ray 

## 2018-05-29 NOTE — ED Notes (Addendum)
ENT, Dr. Kathyrn Sheriff, at bedside.

## 2018-05-29 NOTE — Progress Notes (Signed)
MEDICATION RELATED CONSULT NOTE    Pharmacy Consult for Electrolyte Management Indication: hypokalemia  No Known Allergies   Labs: BMP Latest Ref Rng & Units 05/29/2018  Glucose 70 - 99 mg/dL 338(H)  BUN 6 - 20 mg/dL 9  Creatinine 0.61 - 1.24 mg/dL 0.73  Sodium 135 - 145 mmol/L 134(L)  Potassium 3.5 - 5.1 mmol/L 2.6(LL)  Chloride 98 - 111 mmol/L 88(L)  CO2 22 - 32 mmol/L 30  Calcium 8.9 - 10.3 mg/dL 8.8(L)    Estimated Creatinine Clearance: 132.5 mL/min (by C-G formula based on SCr of 0.73 mg/dL).   Assessment: Patient is 47yo male admitted for pharyngitis. Pharmacy consulted for potassium replacement. MD requested IV supplementation due to severe pharyngitis.  K=2.6, has already received KCl 71mEq PO in the ED. Noted that patient is on Furosemide and digoxin outpatient.  Plan:  Will order KCl 64mEq IV x 4 runs. Will recheck a potassium level along with a magnesium level later tonight. Will follow up on labs and supplement as needed.  Paulina Fusi, PharmD, BCPS 05/29/2018 1:40 PM

## 2018-05-30 DIAGNOSIS — J029 Acute pharyngitis, unspecified: Secondary | ICD-10-CM | POA: Diagnosis not present

## 2018-05-30 DIAGNOSIS — I429 Cardiomyopathy, unspecified: Secondary | ICD-10-CM | POA: Diagnosis not present

## 2018-05-30 DIAGNOSIS — R1310 Dysphagia, unspecified: Secondary | ICD-10-CM | POA: Diagnosis not present

## 2018-05-30 DIAGNOSIS — E876 Hypokalemia: Secondary | ICD-10-CM | POA: Diagnosis not present

## 2018-05-30 DIAGNOSIS — R0602 Shortness of breath: Secondary | ICD-10-CM | POA: Diagnosis not present

## 2018-05-30 DIAGNOSIS — I428 Other cardiomyopathies: Secondary | ICD-10-CM | POA: Diagnosis not present

## 2018-05-30 DIAGNOSIS — I110 Hypertensive heart disease with heart failure: Secondary | ICD-10-CM | POA: Diagnosis not present

## 2018-05-30 DIAGNOSIS — R739 Hyperglycemia, unspecified: Secondary | ICD-10-CM | POA: Diagnosis not present

## 2018-05-30 DIAGNOSIS — I5022 Chronic systolic (congestive) heart failure: Secondary | ICD-10-CM | POA: Diagnosis not present

## 2018-05-30 LAB — BASIC METABOLIC PANEL
Anion gap: 13 (ref 5–15)
BUN: 14 mg/dL (ref 6–20)
CO2: 28 mmol/L (ref 22–32)
CREATININE: 0.68 mg/dL (ref 0.61–1.24)
Calcium: 8.1 mg/dL — ABNORMAL LOW (ref 8.9–10.3)
Chloride: 98 mmol/L (ref 98–111)
GFR calc non Af Amer: >60 mL/min (ref >60)
Glucose, Bld: 403 mg/dL — ABNORMAL HIGH (ref 70–99)
Potassium: 3 mmol/L — ABNORMAL LOW (ref 3.5–5.1)
Sodium: 139 mmol/L (ref 135–145)

## 2018-05-30 LAB — GLUCOSE, CAPILLARY
GLUCOSE-CAPILLARY: 365 mg/dL — AB (ref 70–99)
Glucose-Capillary: 415 mg/dL — ABNORMAL HIGH (ref 70–99)

## 2018-05-30 MED ORDER — AMOXICILLIN-POT CLAVULANATE 875-125 MG PO TABS: 1.0000 | ORAL_TABLET | Freq: Two times a day (BID) | ORAL | 0 refills | Status: AC

## 2018-05-30 MED ORDER — POTASSIUM CHLORIDE CRYS ER 20 MEQ PO TBCR
40.0000 meq | EXTENDED_RELEASE_TABLET | ORAL | Status: DC
  Administered 2018-05-30: 40 meq via ORAL
  Filled 2018-05-30: qty 2

## 2018-05-30 MED ORDER — BLOOD GLUCOSE MONITOR KIT: PACK | 0 refills | Status: AC

## 2018-05-30 MED ORDER — POTASSIUM CHLORIDE CRYS ER 20 MEQ PO TBCR: 20.0000 meq | EXTENDED_RELEASE_TABLET | Freq: Every day | ORAL | 0 refills | Status: DC

## 2018-05-30 MED ORDER — DEXAMETHASONE 4 MG PO TABS: 4.0000 mg | ORAL_TABLET | Freq: Two times a day (BID) | ORAL | 0 refills | Status: AC

## 2018-05-30 MED ORDER — METFORMIN HCL 1000 MG PO TABS: 1000.0000 mg | ORAL_TABLET | Freq: Every day | ORAL | 11 refills | Status: DC

## 2018-05-30 MED ORDER — GLIPIZIDE 5 MG PO TABS: 5.0000 mg | ORAL_TABLET | Freq: Two times a day (BID) | ORAL | 11 refills | Status: DC

## 2018-05-30 MED ORDER — INSULIN ASPART 100 UNIT/ML ~~LOC~~ SOLN
15.0000 [IU] | Freq: Once | SUBCUTANEOUS | Status: AC
  Administered 2018-05-30: 15 [IU] via SUBCUTANEOUS
  Filled 2018-05-30: qty 1

## 2018-05-30 NOTE — Discharge Summary (Addendum)
[place] at [facility], [place] y.o., DOB August 10, 1971, MRN [id number]. Admission date: 05/29/2018 Discharge Date 05/30/2018 Primary MD Carmon Ginsberg, PA Admitting Physician Fritzi Mandes, MD  Admission Diagnosis  Shortness of breath [R06.02] Pharyngitis, unspecified etiology [J02.9]  Discharge Diagnosis   Active Problems: Acute pharyngitis/severe odynophagia Hypokalemia due to diuretics therapy Nonischemic cardiomyopathy Poorly controlled diabetes     Hospital Course  Bardia Wangerin  is a 47 y.o. male with a known history of non-ischemic cardiomyopathy with severe systolic dysfunction EF 93% status post pacemaker/AICD placement, hypertension, hypercholesterolemia comes to the emergency room after he woke up in the middle of the night feeling sore throat. Patient went back to back bed woke up feeling very short of breath and had severe pain while swallowing.  Patient was noted to have severe pharyngitis.  He was started on IV antibiotics and steroids.  With much improvement in his symptoms.  And also was noted to have very high blood sugars consistent with diabetes.  He will need to be started on oral treatment.               Consults  ent  Significant Tests:  See full reports for all details     Dg Neck Soft Tissue  Result Date: 05/29/2018 CLINICAL DATA:  Acute shortness of breath sore throat.  Wheezing. EXAM: NECK SOFT TISSUES - 1+ VIEW COMPARISON:  None. FINDINGS: There is no evidence of retropharyngeal soft tissue swelling or epiglottic enlargement. The cervical airway is unremarkable and no radio-opaque foreign body identified. Minimal calcification at the left carotid bifurcation. IMPRESSION: No significant. Electronically Signed   By: Lorriane Shire M.D.   On: 05/29/2018 07:47   Dg Chest 2 View  Result Date: 05/29/2018 CLINICAL DATA:  Short of breath EXAM: CHEST - 2 VIEW COMPARISON:  02/08/2008 FINDINGS: LEFT-sided pacemaker with  continuous leads overlies normal cardiac silhouette. Lungs are clear. Normal pulmonary vasculature. No effusion, infiltrate pneumothorax. No acute osseous abnormality. IMPRESSION: No acute cardiopulmonary process. Electronically Signed   By: Suzy Bouchard M.D.   On: 05/29/2018 07:52   Ct Soft Tissue Neck W Contrast  Result Date: 05/29/2018 CLINICAL DATA:  47 year old male with throat pain, shortness of breath, and abnormal voice. Unwitnessed fall, query throat injury. EXAM: CT NECK WITH CONTRAST TECHNIQUE: Multidetector CT imaging of the neck was performed using the standard protocol following the bolus administration of intravenous contrast. CONTRAST:  84m OMNIPAQUE IOHEXOL 300 MG/ML  SOLN COMPARISON:  Neck radiographs 0713 hours today. FINDINGS: Pharynx and larynx: The nasopharynx and oropharynx soft tissue contours are within normal limits. There are mild postinflammatory dystrophic calcifications of the right palatine tonsil. The tongue base and epiglottis are normal. At the hypopharynx there is left greater than right bulky soft tissue swelling and/or edema of the aryepiglottic folds (series 2, image 53). Bulky soft tissue thickening in the bilateral piriform sinuses greater on the left (series 2, image 57), although no hyperenhancement or discrete hyperenhancing mass. The hypopharynx is gas distended above the level of soft tissue thickening. The base of the epiglottis and anterior commissure appear to remain normal. The glottis is closed, with no other laryngeal soft tissue abnormality identified. The laryngeal cartilages appear normal. The subglottic trachea is normal. The retropharyngeal space is normal. The parapharyngeal spaces are normal. The cervical esophagus appears to remain normal caudal to the soft tissue thickening. Salivary glands: Negative sublingual space. Bilateral submandibular and parotid glands are symmetric and normal. Thyroid: Negative. Lymph nodes:  No cervical lymphadenopathy. No  soft tissue inflammation identified. Vascular: The major vascular structures in the neck and at the skull base are patent with mild carotid atherosclerosis. Limited intracranial: Negative. Visualized orbits: Negative. Mastoids and visualized paranasal sinuses: Clear. Skeleton: Occasional carious dentition. Lower cervical spine mild disc and endplate degeneration. No acute osseous abnormality identified. Upper chest: Left chest cardiac pacemaker device with subclavian approach leads. No superior mediastinal lymphadenopathy. Normal lung apices. IMPRESSION: 1. Bulky soft tissue mass or swelling at both aryepiglottic folds and piriform sinuses, worse on the left. The epiglottis and remaining larynx appear normal. The soft tissue enlargement is heterogeneous, but without discrete hyperenhancing mass. No lymphadenopathy. The other deep soft tissue spaces of the neck are normal. Direct visualization by ENT is recommended to further characterize. At this point a non-neoplastic process is suspected, with noninfectious inflammation favored. If the patient is on an Ace-inhibitor then Angioedema is most likely. 2. Normal CT appearance of the neck elsewhere. Negative visible upper chest. Electronically Signed   By: Genevie Ann M.D.   On: 05/29/2018 09:46       Today   Subjective:   Cody [name] feeling much better swallowing much improved Objective:   Blood pressure 126/81, pulse 66, temperature 97.8 F (36.6 C), temperature source Oral, resp. rate 18, height _0  (1.727 m), weight 96.2 kg (212 lb), SpO2 99 %.  .  Intake/Output Summary (Last 24 hours) at 05/30/2018 1314 Last data filed at 05/29/2018 2344 Gross per 24 hour  Intake 200 ml  Output 650 ml  Net -450 ml    Exam VITAL SIGNS: Blood pressure 126/81, pulse 66, temperature 97.8 F (36.6 C), temperature source Oral, resp. rate 18, height _1  (1.727 m), weight 96.2 kg (212 lb), SpO2 99 %.  GENERAL:  47 y.o.-year-old patient lying in the bed with no  acute distress.  EYES: Pupils equal, round, reactive to light and accommodation. No scleral icterus. Extraocular muscles intact.  HEENT: Head atraumatic, normocephalic. Oropharynx and nasopharynx clear.  NECK:  Supple, no jugular venous distention. No thyroid enlargement, no tenderness.  LUNGS: Normal breath sounds bilaterally, no wheezing, rales,rhonchi or crepitation. No use of accessory muscles of respiration.  CARDIOVASCULAR: S1, S2 normal. No murmurs, rubs, or gallops.  ABDOMEN: Soft, nontender, nondistended. Bowel sounds present. No organomegaly or mass.  EXTREMITIES: No pedal edema, cyanosis, or clubbing.  NEUROLOGIC: Cranial nerves II through XII are intact. Muscle strength 5/5 in all extremities. Sensation intact. Gait not checked.  PSYCHIATRIC: The patient is alert and oriented x 3.  SKIN: No obvious rash, lesion, or ulcer.   Data Review     CBC w Diff:  Lab Results  Component Value Date   WBC 8.3 05/29/2018   HGB 16.3 05/29/2018   HCT 44.5 05/29/2018   PLT 172 05/29/2018   LYMPHOPCT 28 05/29/2018   MONOPCT 9 05/29/2018   EOSPCT 1 05/29/2018   BASOPCT 1 05/29/2018   CMP:  Lab Results  Component Value Date   NA 139 05/30/2018   K 3.0 (L) 05/30/2018   CL 98 05/30/2018   CO2 28 05/30/2018   BUN 14 05/30/2018   CREATININE 0.68 05/30/2018  .  Micro Results Recent Results (from the past 240 hour(s))  Group A Strep by PCR     Status: None   Collection Time: 05/29/18  8:02 AM  Result Value Ref Range Status   Group A Strep by PCR NOT DETECTED NOT DETECTED Final    Comment: Performed at [facility], [address], [place], [place] [address]        Code Status Orders  (From admission, onward)        Start     Ordered   05/29/18 1511  Full code  Continuous     05/29/18 1510    Code Status History    This patient has a current code status but no historical code status.          Follow-up Information    Carmon Ginsberg, [place] Follow up in 6  day(s).   Specialty:  Family Medicine Contact information: [address] [place] [address] [phone]           Discharge Medications   Allergies as of 05/30/2018   No Known Allergies     Medication List    TAKE these medications   allopurinol 100 MG tablet Commonly known as:  ZYLOPRIM Take 300 mg by mouth daily.   amoxicillin-clavulanate 875-125 MG tablet Commonly known as:  AUGMENTIN Take 1 tablet by mouth 2 (two) times daily for 7 days.   aspirin 81 MG tablet Take 81 mg by mouth daily.   blood glucose meter kit and supplies Kit Dispense based on patient and insurance preference. Use up to four times daily as directed. (FOR ICD-9 250.00, 250.01).   carvedilol 3.125 MG tablet Commonly known as:  COREG Take 3.125 mg by mouth 2 (two) times daily with a meal.   dexamethasone 4 MG tablet Commonly known as:  DECADRON Take 1 tablet (4 mg total) by mouth 2 (two) times daily with a meal for 5 days.   digoxin 0.25 MG tablet Commonly known as:  LANOXIN Take 0.25 mg by mouth daily.   ENTRESTO 24-26 MG Generic drug:  sacubitril-valsartan Take 1 tablet by mouth 2 (two) times daily.   furosemide 20 MG tablet Commonly known as:  LASIX Take 40 mg by mouth 2 (two) times daily.   glipiZIDE 5 MG tablet Commonly known as:  GLUCOTROL Take 1 tablet (5 mg total) by mouth 2 (two) times daily.   metFORMIN 1000 MG tablet Commonly known as:  GLUCOPHAGE Take 1 tablet (1,000 mg total) by mouth daily with breakfast.   potassium chloride SA 20 MEQ tablet Commonly known as:  K-DUR,KLOR-CON Take 1 tablet (20 mEq total) by mouth daily for 3 days.          Total Time in preparing paper work, data evaluation and todays exam - 8 minutes  Dustin Flock M.D on 05/30/2018 at [place]  [phone]

## 2018-05-30 NOTE — Progress Notes (Signed)
Pharmacy Electrolyte Monitoring Consult:  Pharmacy consulted to assist in monitoring and replacing electrolytes in this 47 y.o. male admitted on 05/29/2018 with Shortness of Breath and Sore Throat   Labs:  Sodium (mmol/L)  Date Value  05/30/2018 139   Potassium (mmol/L)  Date Value  05/30/2018 3.0 (L)   Magnesium (mg/dL)  Date Value  05/29/2018 1.8   Calcium (mg/dL)  Date Value  05/30/2018 8.1 (L)    Assessment/Plan: KCl 40 meq po x 2 doses today. Patient remains on Lasix. Will f/u AM labs.   Ulice Dash D 05/30/2018 9:02 AM

## 2018-05-30 NOTE — Progress Notes (Signed)
MEDICATION RELATED CONSULT NOTE    Pharmacy Consult for Electrolyte Management Indication: hypokalemia  No Known Allergies   Labs: BMP Latest Ref Rng & Units 05/29/2018 05/29/2018  Glucose 70 - 99 mg/dL - 338(H)  BUN 6 - 20 mg/dL - 9  Creatinine 0.61 - 1.24 mg/dL - 0.73  Sodium 135 - 145 mmol/L - 134(L)  Potassium 3.5 - 5.1 mmol/L 4.0 2.6(LL)  Chloride 98 - 111 mmol/L - 88(L)  CO2 22 - 32 mmol/L - 30  Calcium 8.9 - 10.3 mg/dL - 8.8(L)    Estimated Creatinine Clearance: 132.5 mL/min (by C-G formula based on SCr of 0.73 mg/dL).   Assessment: Patient is 47yo male admitted for pharyngitis. Pharmacy consulted for potassium replacement. MD requested IV supplementation due to severe pharyngitis.  K=2.6, has already received KCl 41mEq PO in the ED. Noted that patient is on Furosemide and digoxin outpatient.  Plan:  Will order KCl 57mEq IV x 4 runs. Will recheck a potassium level along with a magnesium level later tonight. Will follow up on labs and supplement as needed.  7/12 @ 23:00 :   K = 4 ,  Mag = 1.8 No additional electrolytes needed at this time.   Will recheck BMP on 7/13 with AM labs.  Barry Culverhouse D 05/30/2018 12:20 AM

## 2018-06-01 ENCOUNTER — Telehealth: Payer: Self-pay

## 2018-06-01 NOTE — Telephone Encounter (Signed)
Transition Care Management Follow-up Telephone Call  How have you been since you were released from the hospital? Doing better, throat is still sore and swollen but not as bad. Pt is able to swallow with no problems. BS was 441 yesterday and pt has not been eating much, however they were told this could happen due to steroid use. Declines fever, n/v/d.   Do you understand why you were in the hospital? yes  Do you have a copy of your discharge instructions Yes Do you understand the discharge instrcutions? yes  Where were you discharged to? Home  Do you have support at home? Yes    Items Reviewed:  Medications obtained Yes  Medications reviewed: Yes  Dietary changes reviewed: yes  Home Health? N/A  DME ordered at discharge obtained? No  Medical supplies: NA   Functional Questionnaire:   Activities of Daily Living (ADLs):   He states they are independent in the following: ambulation, bathing and hygiene, feeding, continence, grooming, toileting, dressing and medication management States they require assistance with the following: N/A  Any transportation issues/concerns?: no  Any patient concerns? no  Confirmed importance and date/time of follow-up visits scheduled with PCP: yes, 06/05/18 @ 3 PM  Confirm appointment scheduled with specialist? N/A  Confirmed with patient if condition begins to worsen call PCP or If it's emergency go to the ER.

## 2018-06-05 ENCOUNTER — Ambulatory Visit (INDEPENDENT_AMBULATORY_CARE_PROVIDER_SITE_OTHER): Payer: Medicare Other | Admitting: Family Medicine

## 2018-06-05 ENCOUNTER — Encounter: Payer: Self-pay | Admitting: Family Medicine

## 2018-06-05 VITALS — BP 142/72 | HR 77 | Temp 98.3°F | Resp 16 | Wt 207.6 lb

## 2018-06-05 DIAGNOSIS — E119 Type 2 diabetes mellitus without complications: Secondary | ICD-10-CM

## 2018-06-05 DIAGNOSIS — E782 Mixed hyperlipidemia: Secondary | ICD-10-CM | POA: Diagnosis not present

## 2018-06-05 LAB — GLUCOSE, POCT (MANUAL RESULT ENTRY): POC Glucose: 273 mg/dL — AB (ref 70–99)

## 2018-06-05 NOTE — Patient Instructions (Addendum)
We will call you about the lab results. Glucose goals are usually < 130 fasting in the AM and 180 two hours after a meal.

## 2018-06-05 NOTE — Progress Notes (Signed)
  Subjective:     Patient ID: Cody [name], male   DOB: [birth year], 47 y.o.   MRN: [id number] Chief Complaint  Patient presents with  . Hospitalization Follow-up    Patient returns to office for follow up after being seen at [facility] 05/29/18 with complaints of shortness of breath and dysphagia. Blood work done at hospital showed a glucose level of 403 and HGbA1C of 11.2%. Patient was started on Glipizide 5mg  BID and Metformin 1000mg  qd. Patient reports good compliance and fair tolerance on medication he states that blood sugar readings non fasting have been between 272-280.   HPI He has prior hx of diabetes in 2014 which he felt was diet controlled but has had no f/u for this. He has just completed a course of oral steroids and will be completing the abx in the next two days. Reports compliance with diabetes medication but he has not been taking glipizide 30 minutes before a meal which I recommended. States he walks for exercise. Accompanied by his wife today.  Review of Systems     Objective:   Physical Exam  Constitutional: He appears well-developed and well-nourished. No distress.       Assessment:    1. Diabetes mellitus without complication ([place]) - POCT glucose (manual entry)  2. Mixed hyperlipidemia - Lipid panel    Plan:    Discussed glucose parameters with further f/u pending lab results and in 4 weeks. He received diabetes teaching in the hospital. Handout regarding low glycemic foods provided.

## 2018-06-06 LAB — LIPID PANEL
CHOL/HDL RATIO: 4.4 ratio (ref 0.0–5.0)
CHOLESTEROL TOTAL: 205 mg/dL — AB (ref 100–199)
HDL: 47 mg/dL (ref >39)
LDL CALC: 129 mg/dL — AB (ref 0–99)
TRIGLYCERIDES: 145 mg/dL (ref 0–149)
VLDL CHOLESTEROL CAL: 29 mg/dL (ref 5–40)

## 2018-06-08 ENCOUNTER — Telehealth: Payer: Self-pay

## 2018-06-08 ENCOUNTER — Other Ambulatory Visit: Payer: Self-pay | Admitting: Family Medicine

## 2018-06-08 MED ORDER — POTASSIUM CHLORIDE CRYS ER 20 MEQ PO TBCR: EXTENDED_RELEASE_TABLET | ORAL | 1 refills | Status: DC

## 2018-06-08 NOTE — Telephone Encounter (Signed)
Wife was advised. KW 

## 2018-06-08 NOTE — Telephone Encounter (Signed)
-----   Message from Carmon Ginsberg, [place] sent at 06/08/2018  7:26 AM EDT ----- Mildly elevated cholesterol. Will discuss further at next office visit.

## 2018-06-08 NOTE — Telephone Encounter (Signed)
Wife was advised of lab report she wants to know if patient should still continue to be on potassium? Since the Er doctor had placed patient on it. She states that patient is out of potassium pills now and if patient needed to be on it still she would like prescription sent to pharmacy. KW

## 2018-06-08 NOTE — Telephone Encounter (Signed)
Continue potassium-I have refilled it.

## 2018-06-08 NOTE — Telephone Encounter (Signed)
[id number] 

## 2018-06-18 ENCOUNTER — Telehealth: Payer: Self-pay | Admitting: Family Medicine

## 2018-06-18 MED ORDER — AMOXICILLIN-POT CLAVULANATE 875-125 MG PO TABS: 1.0000 | ORAL_TABLET | Freq: Two times a day (BID) | ORAL | 0 refills | Status: DC

## 2018-06-18 NOTE — Telephone Encounter (Signed)
Wife states that patient is still complaining of a sore throat and pain when swallowing, she states it never improved on antibiotic and wasn't sure if patient needed another round of medicine? KW

## 2018-06-18 NOTE — Telephone Encounter (Signed)
Pt's wife Angie contacted office for refill request on the following medications:  amoxicillin-clavulanate (AUGMENTIN) 875-125 MG tablet   [facility] Joetta Manners stated pt was in the hospital and was given the Rx for the medication but pt doesn't feel completely well. Angie stated pt saw Mikki Santee for OV since his hospital discharge. Angie stated they think another round of the medication will help pt to feel completely well. Please advise. Thanks TNP

## 2018-06-18 NOTE — Telephone Encounter (Signed)
Per Mikki Santee okay to prescribe, Rx has been sent in electronically. KW

## 2018-06-18 NOTE — Telephone Encounter (Signed)
Left message for wife to call back to see what symptoms patient is complaining of. Amparo Bristol

## 2018-06-19 NOTE — Telephone Encounter (Signed)
Also tell patient I want him to call ENT, Dr. Kathyrn Sheriff, who saw him in the hospital and let him know also about his symptoms.

## 2018-06-23 NOTE — Telephone Encounter (Signed)
Wife has been advised. KW 

## 2018-06-23 NOTE — Telephone Encounter (Signed)
[id number] 

## 2018-07-03 ENCOUNTER — Ambulatory Visit (INDEPENDENT_AMBULATORY_CARE_PROVIDER_SITE_OTHER): Payer: Medicare Other | Admitting: Family Medicine

## 2018-07-03 ENCOUNTER — Encounter: Payer: Self-pay | Admitting: Family Medicine

## 2018-07-03 VITALS — BP 92/68 | HR 84 | Temp 98.9°F | Resp 16 | Wt 211.4 lb

## 2018-07-03 DIAGNOSIS — E119 Type 2 diabetes mellitus without complications: Secondary | ICD-10-CM

## 2018-07-03 LAB — GLUCOSE, POCT (MANUAL RESULT ENTRY): POC Glucose: 91 mg/dl (ref 70–99)

## 2018-07-03 NOTE — Progress Notes (Signed)
  Subjective:     Patient ID: Cody [name], male   DOB: 03-26-1971, 47 y.o.   MRN: [id number] Chief Complaint  Patient presents with  . Diabetes    Patient returns to office today for 4 week follow up, patient was last sen 05/29/18 and HgbA1C was 11.2% with a glucose of 273. Patient reports good compliance and tolerance on medication, he denies symptoms of polydipsia or polyuria. Patient reports that glucose readings at home range from 96-191.   HPI Accompanied by his wife today. Continues to be followed by cardiology every 6 months.  Review of Systems     Objective:   Physical Exam  Constitutional: He appears well-developed and well-nourished. No distress.  Lungs: clear Heart: RRR without murmur Lower extremities: no edema; pedal pulses intact, sensation to monofilament intact, no wounds noted.     Assessment:    1. Diabetes mellitus without complication ([name]) - POCT glucose (manual entry)    Plan:    Encourage eye exam. Will recheck lipid profile and A1C at next office visit.

## 2018-07-03 NOTE — Patient Instructions (Signed)
Do get an eye exam as planned. We will recheck your cholesterol and A1C at your next visit.

## 2018-09-29 DIAGNOSIS — I5022 Chronic systolic (congestive) heart failure: Secondary | ICD-10-CM | POA: Diagnosis not present

## 2018-10-02 ENCOUNTER — Ambulatory Visit: Payer: Self-pay | Admitting: Family Medicine

## 2018-10-09 ENCOUNTER — Other Ambulatory Visit: Payer: Self-pay | Admitting: Family Medicine

## 2018-10-09 ENCOUNTER — Encounter: Payer: Self-pay | Admitting: Family Medicine

## 2018-10-09 ENCOUNTER — Ambulatory Visit (INDEPENDENT_AMBULATORY_CARE_PROVIDER_SITE_OTHER): Payer: Medicare Other | Admitting: Family Medicine

## 2018-10-09 VITALS — BP 100/70 | HR 96 | Temp 98.7°F | Resp 16 | Wt 220.2 lb

## 2018-10-09 DIAGNOSIS — I10 Essential (primary) hypertension: Secondary | ICD-10-CM | POA: Diagnosis not present

## 2018-10-09 DIAGNOSIS — E782 Mixed hyperlipidemia: Secondary | ICD-10-CM | POA: Diagnosis not present

## 2018-10-09 DIAGNOSIS — E119 Type 2 diabetes mellitus without complications: Secondary | ICD-10-CM

## 2018-10-09 LAB — POCT GLYCOSYLATED HEMOGLOBIN (HGB A1C): Hemoglobin A1C: 5.4 % (ref 4.0–5.6)

## 2018-10-09 NOTE — Progress Notes (Deleted)
dia

## 2018-10-09 NOTE — Progress Notes (Addendum)
  Subjective:     Patient ID: Cody [name], male   DOB: May 05, 1971, 47 y.o.   MRN: [id number] Chief Complaint  Patient presents with  . Diabetes    Patient returns to office for 3 month follow up from 07/03/18, last HgbA1C was frawn on 05/29/18 and was 11.2%. Patient states that his blood sugar averages between 65-130. Patient denies polydipsia or polyuria, he denies visual changes or changes to his feet. Patient reports good compliance and tolerance on medication.    HPI States he feels well and continues to be followed by cardiology for non-ischemic cardiomyopathy. No hypoglycemic episodes reported. Accompanied by his wife today.Defers flu vaccine.  Review of Systems     Objective:   Physical Exam  Constitutional: He appears well-developed and well-nourished. No distress.  Cardiovascular: Normal rate and regular rhythm.  Pulmonary/Chest: Breath sounds normal.  Musculoskeletal: He exhibits no edema (of distal lower extremities).       Assessment:    1. Diabetes mellitus without complication [facility]): well controlled on current regimen - POCT glycosylated hemoglobin (Hb A1C)  2. Benign hypertension: per cardiology   3. Mixed hyperlipidemia: will discuss statin drug once received. - Lipid panel    Plan:    Further f/u pending lab results.

## 2018-10-09 NOTE — Patient Instructions (Signed)
We will call you with the lab results. 

## 2018-10-21 DIAGNOSIS — I472 Ventricular tachycardia: Secondary | ICD-10-CM | POA: Diagnosis not present

## 2018-10-21 DIAGNOSIS — E782 Mixed hyperlipidemia: Secondary | ICD-10-CM | POA: Diagnosis not present

## 2018-10-21 DIAGNOSIS — I447 Left bundle-branch block, unspecified: Secondary | ICD-10-CM | POA: Diagnosis not present

## 2018-10-21 DIAGNOSIS — I10 Essential (primary) hypertension: Secondary | ICD-10-CM | POA: Diagnosis not present

## 2018-10-21 DIAGNOSIS — I420 Dilated cardiomyopathy: Secondary | ICD-10-CM | POA: Diagnosis not present

## 2018-10-21 DIAGNOSIS — Z950 Presence of cardiac pacemaker: Secondary | ICD-10-CM | POA: Diagnosis not present

## 2018-10-21 DIAGNOSIS — R0602 Shortness of breath: Secondary | ICD-10-CM | POA: Diagnosis not present

## 2018-10-21 DIAGNOSIS — I509 Heart failure, unspecified: Secondary | ICD-10-CM | POA: Diagnosis not present

## 2018-10-21 DIAGNOSIS — I5022 Chronic systolic (congestive) heart failure: Secondary | ICD-10-CM | POA: Diagnosis not present

## 2018-10-21 DIAGNOSIS — I429 Cardiomyopathy, unspecified: Secondary | ICD-10-CM | POA: Diagnosis not present

## 2018-10-22 LAB — LIPID PANEL
CHOL/HDL RATIO: 6.6 ratio — AB (ref 0.0–5.0)
Cholesterol, Total: 186 mg/dL (ref 100–199)
HDL: 28 mg/dL — AB (ref >39)
LDL CALC: 118 mg/dL — AB (ref 0–99)
Triglycerides: 201 mg/dL — ABNORMAL HIGH (ref 0–149)
VLDL CHOLESTEROL CAL: 40 mg/dL (ref 5–40)

## 2018-10-30 DIAGNOSIS — I509 Heart failure, unspecified: Secondary | ICD-10-CM | POA: Diagnosis not present

## 2018-10-30 DIAGNOSIS — R0602 Shortness of breath: Secondary | ICD-10-CM | POA: Diagnosis not present

## 2018-10-30 DIAGNOSIS — I420 Dilated cardiomyopathy: Secondary | ICD-10-CM | POA: Diagnosis not present

## 2019-01-11 ENCOUNTER — Encounter: Payer: Self-pay | Admitting: Family Medicine

## 2019-01-11 ENCOUNTER — Ambulatory Visit (INDEPENDENT_AMBULATORY_CARE_PROVIDER_SITE_OTHER): Payer: Medicare Other | Admitting: Family Medicine

## 2019-01-11 VITALS — BP 104/80 | HR 88 | Temp 98.2°F | Resp 16 | Wt 210.6 lb

## 2019-01-11 DIAGNOSIS — E782 Mixed hyperlipidemia: Secondary | ICD-10-CM

## 2019-01-11 DIAGNOSIS — E119 Type 2 diabetes mellitus without complications: Secondary | ICD-10-CM

## 2019-01-11 LAB — POCT GLYCOSYLATED HEMOGLOBIN (HGB A1C): HEMOGLOBIN A1C: 5.3 % (ref 4.0–5.6)

## 2019-01-11 NOTE — Patient Instructions (Signed)
Decrease glipizide to one time a day for a week. If fasting sugar remains < 140 may stop all of the glipizide and continue the metformin. Do establish with a new doctor for your next visit in 3 months.

## 2019-01-11 NOTE — Progress Notes (Signed)
  Subjective:     Patient ID: Cody [name], male   DOB: [birth year], 48 y.o.   MRN: [id number] Chief Complaint  Patient presents with  . Diabetes    Patient returns to office today for 3 month follow up, patient was last seen 10/09/18 HgbA1C in office was 5.4%. Patient denies symptoms of increased thirst or urination.   . Hypertension    Patient returns for 3 month follow up from 10/09/18 blood pressure at visit was 100/70, patient reports that he saw cardiologist 6 months ago and condition was stable. Patient reports that blood pressure readings outside office are WNL.   Marland Kitchen Hyperlipidemia    Patient returns for 3 month follow up from 10/09/18, patients last lipid panel showed elevated cholesterol and it was recommended that patient start a statin drug. Patient states that he did not start medication and has been working on lifestyle changes.    HPI He has lost 10# since prior office visit. Still wishes to defer on cholesterol medication until seen by his new provider.Accompanied by his wife today.  Review of Systems     Objective:   Physical Exam Constitutional:      General: He is not in acute distress. Cardiovascular:     Rate and Rhythm: Normal rate and regular rhythm.  Pulmonary:     Effort: Pulmonary effort is normal.     Breath sounds: Normal breath sounds.  Musculoskeletal:     Right lower leg: No edema.     Left lower leg: No edema.  Neurological:     Mental Status: He is alert.        Assessment:    1. Diabetes mellitus without complication ([place]): decrease medication due to weight loss - POCT glycosylated hemoglobin (Hb A1C)  2. Mixed hyperlipidemia    Plan:    Decrease to one glipizide daily for one week. If fasting sugars < 140 may discontinue. Stay on the metformin. Discuss need for cholesterol medication with your next provider.

## 2019-02-19 ENCOUNTER — Inpatient Hospital Stay: Admission: RE | Admit: 2019-02-19 | Payer: Medicare Other | Source: Ambulatory Visit

## 2019-03-05 ENCOUNTER — Ambulatory Visit: Admission: RE | Admit: 2019-03-05 | Payer: Medicare Other | Source: Home / Self Care | Admitting: Cardiology

## 2019-03-05 ENCOUNTER — Encounter: Admission: RE | Payer: Self-pay | Source: Home / Self Care

## 2019-03-05 SURGERY — ICD GENERATOR CHANGE
Anesthesia: Choice

## 2019-03-22 ENCOUNTER — Telehealth: Payer: Self-pay | Admitting: Family Medicine

## 2019-03-22 MED ORDER — POTASSIUM CHLORIDE CRYS ER 20 MEQ PO TBCR: EXTENDED_RELEASE_TABLET | ORAL | 1 refills | Status: DC

## 2019-03-22 NOTE — Telephone Encounter (Signed)
[place] faxed refill request for the following medications:  potassium chloride SA (K-DUR,KLOR-CON) 20 MEQ tablet   Please advise.

## 2019-03-22 NOTE — Telephone Encounter (Signed)
LMTCB to schedule a 3 month follow/CPE appointment.

## 2019-03-22 NOTE — Telephone Encounter (Signed)
Needs to have CPE/diabetes f/u in ~3 months.  Can go ahead and get him on the schedule for that

## 2019-03-31 NOTE — Telephone Encounter (Signed)
LMTCB

## 2019-06-14 ENCOUNTER — Other Ambulatory Visit: Payer: Self-pay

## 2019-06-14 ENCOUNTER — Encounter: Payer: Self-pay | Admitting: Physician Assistant

## 2019-06-14 ENCOUNTER — Ambulatory Visit (INDEPENDENT_AMBULATORY_CARE_PROVIDER_SITE_OTHER): Payer: Medicare Other | Admitting: Physician Assistant

## 2019-06-14 VITALS — BP 122/86 | HR 98 | Temp 98.8°F | Wt 219.2 lb

## 2019-06-14 DIAGNOSIS — I420 Dilated cardiomyopathy: Secondary | ICD-10-CM

## 2019-06-14 DIAGNOSIS — I10 Essential (primary) hypertension: Secondary | ICD-10-CM

## 2019-06-14 DIAGNOSIS — E119 Type 2 diabetes mellitus without complications: Secondary | ICD-10-CM

## 2019-06-14 DIAGNOSIS — I472 Ventricular tachycardia: Secondary | ICD-10-CM

## 2019-06-14 DIAGNOSIS — E782 Mixed hyperlipidemia: Secondary | ICD-10-CM | POA: Diagnosis not present

## 2019-06-14 DIAGNOSIS — I4729 Other ventricular tachycardia: Secondary | ICD-10-CM

## 2019-06-14 LAB — POCT GLYCOSYLATED HEMOGLOBIN (HGB A1C)
Est. average glucose Bld gHb Est-mCnc: 123
Hemoglobin A1C: 5.9 % — AB (ref 4.0–5.6)

## 2019-06-14 MED ORDER — METFORMIN HCL 1000 MG PO TABS: 1000.0000 mg | ORAL_TABLET | Freq: Every day | ORAL | 3 refills | Status: DC

## 2019-06-14 NOTE — Progress Notes (Signed)
Patient: Cody [name] Male    DOB: 1971-09-24   48 y.o.   MRN: [id number] Visit Date: 06/17/2019  Today's Provider: Mar Daring, PA-C   Chief Complaint  Patient presents with  . Diabetes  . Hypertension  . Hyperlipidemia   Subjective:    HPI  Diabetes Mellitus Type II, Follow-up:   Lab Results  Component Value Date   HGBA1C 5.9 (A) 06/14/2019   HGBA1C 5.3 01/11/2019   HGBA1C 5.4 10/09/2018    Last seen for diabetes 5 months ago.  Management since then includes none. He reports good compliance with treatment. He is not having side effects.  Current symptoms include none and have been stable. Home blood sugar records: 90-140  Episodes of hypoglycemia? no   Current insulin regiment: Is not on insulin Most Recent Eye Exam: not done Weight trend: stable Prior visit with dietician: No Current exercise: walking Current diet habits: in general, a "healthy" diet    Pertinent Labs:    Component Value Date/Time   CHOL 186 10/21/2018 1530   TRIG 201 (H) 10/21/2018 1530   HDL 28 (L) 10/21/2018 1530   LDLCALC 118 (H) 10/21/2018 1530   CREATININE 0.68 05/30/2018 0628    Wt Readings from Last 3 Encounters:  06/14/19 219 lb 3.2 oz (99.4 kg)  01/11/19 210 lb 9.6 oz (95.5 kg)  10/09/18 220 lb 3.2 oz (99.9 kg)    ------------------------------------------------------------------------  Hypertension, follow-up:  BP Readings from Last 3 Encounters:  06/14/19 122/86  01/11/19 104/80  10/09/18 100/70    He was last seen for hypertension 5 months ago.  BP at that visit was 104/80. Management changes since that visit include none. He reports good compliance with treatment. He is not having side effects.  He is exercising. He is adherent to low salt diet.   Outside blood pressures are not being checked. He is experiencing none.  Patient denies chest pain, chest pressure/discomfort, fatigue, irregular heart beat, palpitations and tachypnea.    Cardiovascular risk factors include diabetes mellitus, hypertension and male gender.  Use of agents associated with hypertension: none.     Weight trend: stable Wt Readings from Last 3 Encounters:  06/14/19 219 lb 3.2 oz (99.4 kg)  01/11/19 210 lb 9.6 oz (95.5 kg)  10/09/18 220 lb 3.2 oz (99.9 kg)    Current diet: in general, a "healthy" diet    ------------------------------------------------------------------------   Lipid/Cholesterol, Follow-up:   Last seen for this5 months ago.  Management changes since that visit include none. . Last Lipid Panel:    Component Value Date/Time   CHOL 186 10/21/2018 1530   TRIG 201 (H) 10/21/2018 1530   HDL 28 (L) 10/21/2018 1530   CHOLHDL 6.6 (H) 10/21/2018 1530   LDLCALC 118 (H) 10/21/2018 1530    Risk factors for vascular disease include diabetes mellitus, hypercholesterolemia and hypertension  He reports good compliance with treatment. He is not having side effects.  Current symptoms include none and have been stable. Weight trend: stable Prior visit with dietician: no Current diet: in general, a "healthy" diet   Current exercise: walking  Wt Readings from Last 3 Encounters:  06/14/19 219 lb 3.2 oz (99.4 kg)  01/11/19 210 lb 9.6 oz (95.5 kg)  10/09/18 220 lb 3.2 oz (99.9 kg)   -------------------------------------------------------------------   No Known Allergies   Current Outpatient Medications:  .  allopurinol (ZYLOPRIM) 100 MG tablet, Take 300 mg by mouth daily. , Disp: , Rfl:  .  aspirin 81 MG tablet, Take 81 mg by mouth daily. , Disp: , Rfl:  .  blood glucose meter kit and supplies KIT, Dispense based on patient and insurance preference. Use up to four times daily as directed. (FOR ICD-9 250.00, 250.01)., Disp: 1 each, Rfl: 0 .  carvedilol (COREG) 3.125 MG tablet, Take 3.125 mg by mouth 2 (two) times daily with a meal. , Disp: , Rfl:  .  digoxin (LANOXIN) 0.25 MG tablet, Take 0.25 mg by mouth daily., Disp: , Rfl:   .  ENTRESTO 24-26 MG, Take 1 tablet by mouth 2 (two) times daily. , Disp: , Rfl: 0 .  furosemide (LASIX) 20 MG tablet, Take 40 mg by mouth daily. , Disp: , Rfl:  .  potassium chloride SA (K-DUR) 20 MEQ tablet, One tablet daily, Disp: 90 tablet, Rfl: 1  Review of Systems  Constitutional: Negative.   Respiratory: Negative.   Cardiovascular: Negative.   Gastrointestinal: Negative.   Endocrine: Negative.   Neurological: Negative.     Social History   Tobacco Use  . Smoking status: Never Smoker  . Smokeless tobacco: Never Used  Substance Use Topics  . Alcohol use: Yes    Comment: occasional      Objective:   BP 122/86 (BP Location: Right Arm, Patient Position: Sitting, Cuff Size: Normal)   Pulse 98   Temp 98.8 F (37.1 C) (Oral)   Wt 219 lb 3.2 oz (99.4 kg)   SpO2 97%   BMI 33.33 kg/m  Vitals:   06/14/19 1356  BP: 122/86  Pulse: 98  Temp: 98.8 F (37.1 C)  TempSrc: Oral  SpO2: 97%  Weight: 219 lb 3.2 oz (99.4 kg)     Physical Exam Vitals signs reviewed.  Constitutional:      General: He is not in acute distress.    Appearance: Normal appearance. He is well-developed. He is not ill-appearing or diaphoretic.  HENT:     Head: Normocephalic and atraumatic.  Neck:     Musculoskeletal: Normal range of motion and neck supple.     Thyroid: No thyromegaly.     Vascular: No JVD.     Trachea: No tracheal deviation.  Cardiovascular:     Rate and Rhythm: Normal rate and regular rhythm.     Pulses: Normal pulses.     Heart sounds: Normal heart sounds. No murmur. No friction rub. No gallop.   Pulmonary:     Effort: Pulmonary effort is normal. No respiratory distress.     Breath sounds: Normal breath sounds. No wheezing or rales.  Lymphadenopathy:     Cervical: No cervical adenopathy.  Skin:    Capillary Refill: Capillary refill takes less than 2 seconds.  Neurological:     General: No focal deficit present.     Mental Status: He is alert. Mental status is at  baseline.     Motor: No weakness.     Gait: Gait normal.  Psychiatric:        Mood and Affect: Mood normal.        Behavior: Behavior normal.        Thought Content: Thought content normal.        Judgment: Judgment normal.      Results for orders placed or performed in visit on 06/14/19  POCT glycosylated hemoglobin (Hb A1C)  Result Value Ref Range   Hemoglobin A1C 5.9 (A) 4.0 - 5.6 %   HbA1c POC (<> result, manual entry)     HbA1c, POC (prediabetic range)  HbA1c, POC (controlled diabetic range)     Est. average glucose Bld gHb Est-mCnc 123        Assessment & Plan    1. Diabetes mellitus without complication (H[facility]) E3O well controlled at 5.9. Patient has not been taking glipizide 45m BID due to hypoglycemic episodes. Has been taking metformin 10062mdaily. Since his blood sugar is now below goal at 5.9, will discontinue metformin. I will see him back in 3 months. If A1c increases will restart. He is agreeable.   2. Benign hypertension Stable. COntinue Carvedilol 3.12544mID, Entresto 24-19m77mD, digoxin 0.25mg30mly, furosemide 40mg 57my. Followed by Cardiology. Has ICD/pacemaker in situ.   3. Mixed hyperlipidemia Diet controlled.  4. Nonischemic dilated cardiomyopathy (H[facility]) [place] for #2.  5. NSVT (nonsustained ventricular tachycardia) (HCC) [place]. This occurred almost 12 years ago per patient.     JennifMar Daring  [facility]

## 2019-06-17 ENCOUNTER — Encounter: Payer: Self-pay | Admitting: Physician Assistant

## 2019-07-14 IMAGING — CT CT NECK W/ CM
3 of 5 series · 12 of 33 positions shown, 14 images · IV contrast (omnipaque)
Comparison: Neck radiographs 4871 hours today.

CLINICAL DATA: 46-year-old male with throat pain, shortness of
breath, and abnormal voice. Unwitnessed fall, query throat injury.

EXAM:
CT NECK WITH CONTRAST
TECHNIQUE: Multidetector CT imaging of the neck was performed using the
standard protocol following the bolus administration of intravenous
contrast.
CONTRAST:  75mL OMNIPAQUE IOHEXOL 300 MG/ML  SOLN

[Series 6: sag neck · sagittal · 0.57mm/px · 5 of 90 slices shown, 6 images]
[im 30/90  bone]
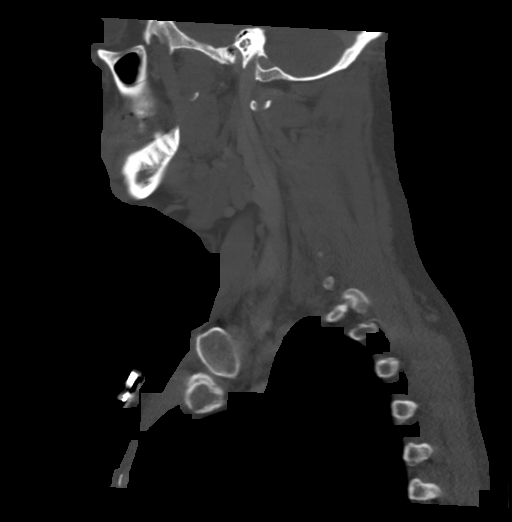
[im 38/90  bone]
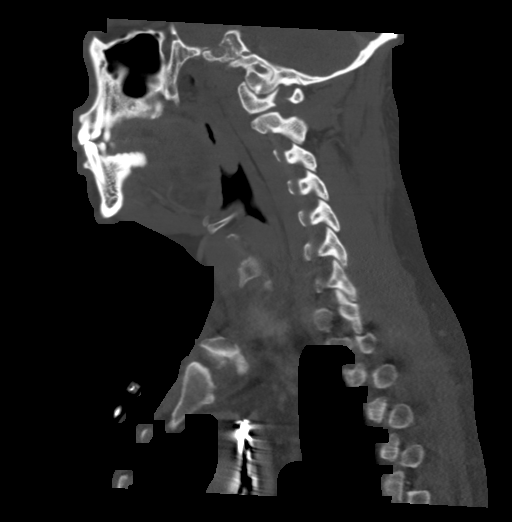
[im 45/90  soft-tissue]
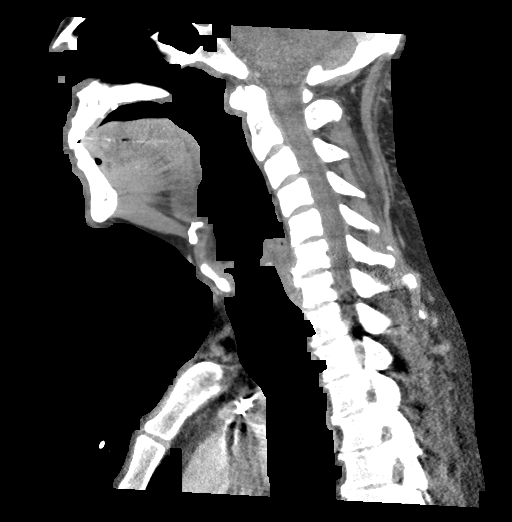
[im 45/90  bone]
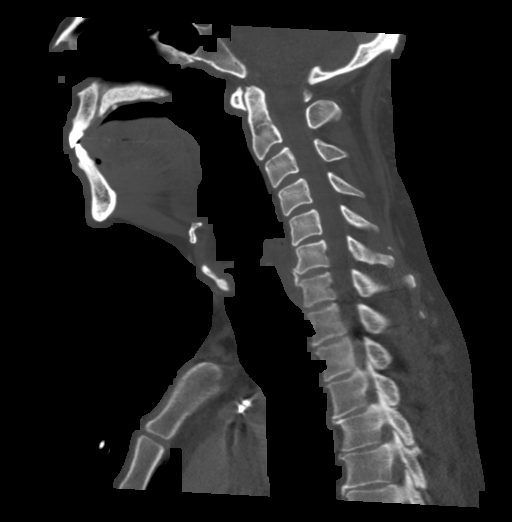
[im 52/90  bone]
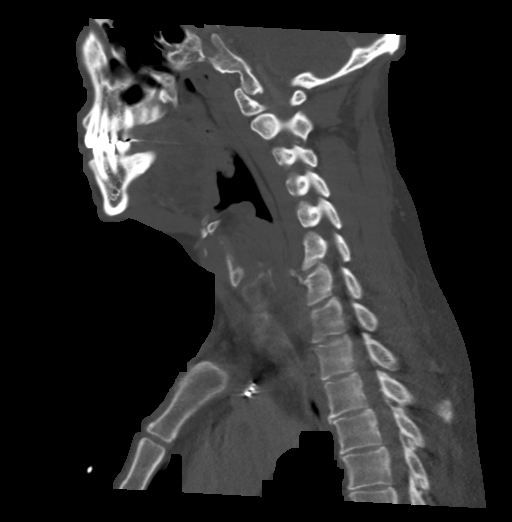
[im 60/90  bone]
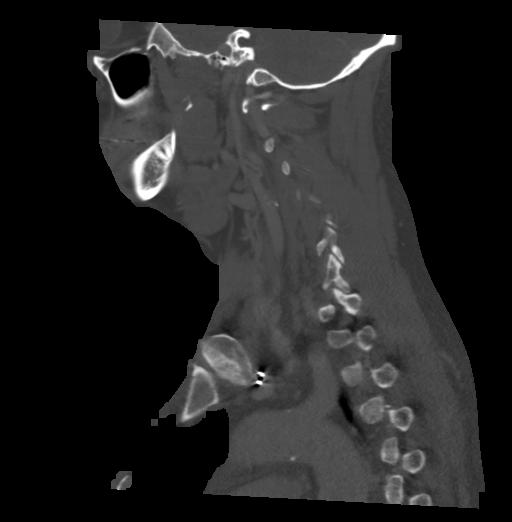

[Series 7: cor neck · coronal · 0.43mm/px · 3 of 141 slices shown]
[im 29/141  bone]
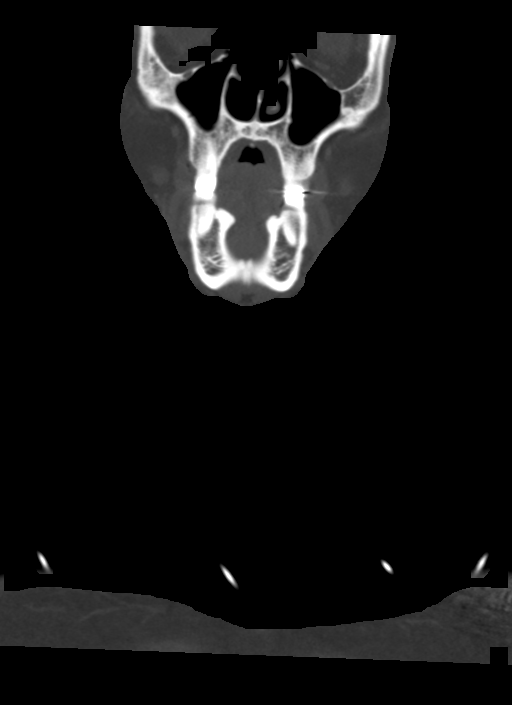
[im 57/141  bone]
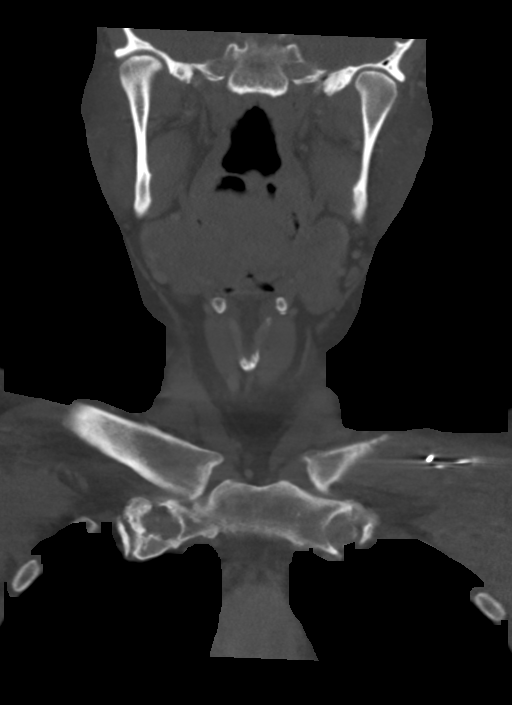
[im 85/141  bone]
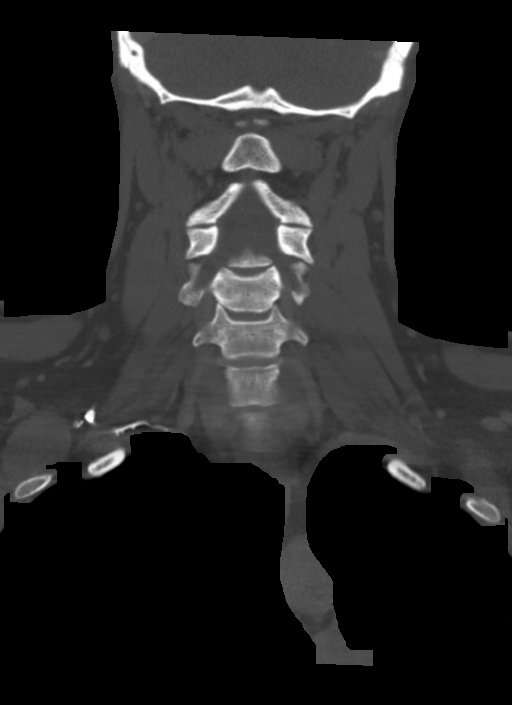

[Series 8: orthogonal ax · axial · 0.44mm/px · z∈[-72,+100]mm · 4 of 144 slices shown, 5 images]
[im 29/144  soft-tissue]
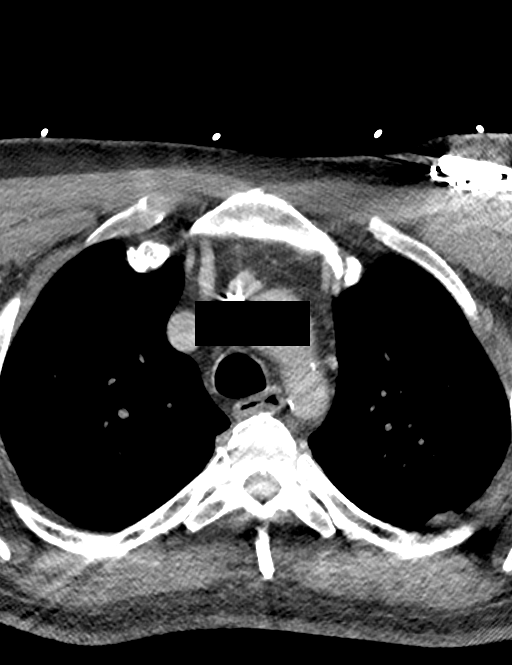
[im 29/144  bone]
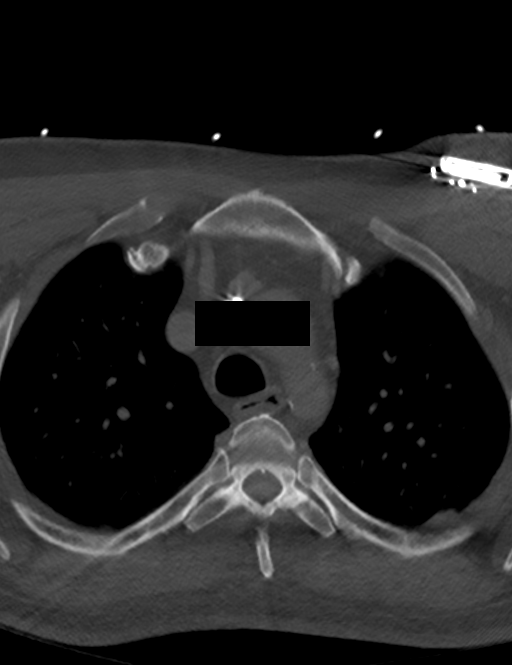
[im 58/144  bone]
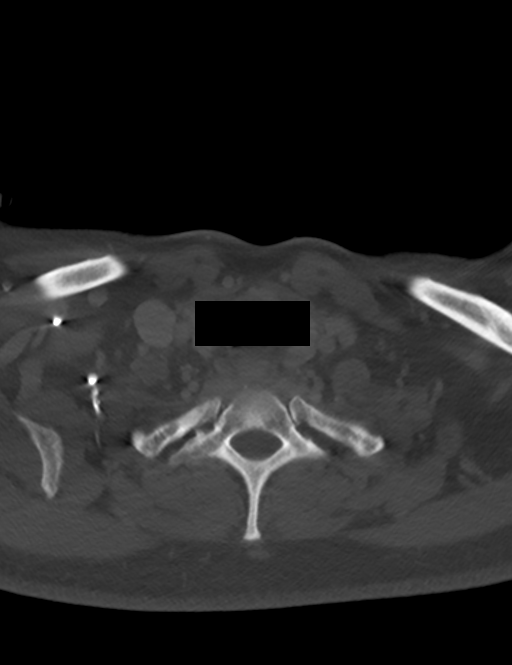
[im 86/144  bone]
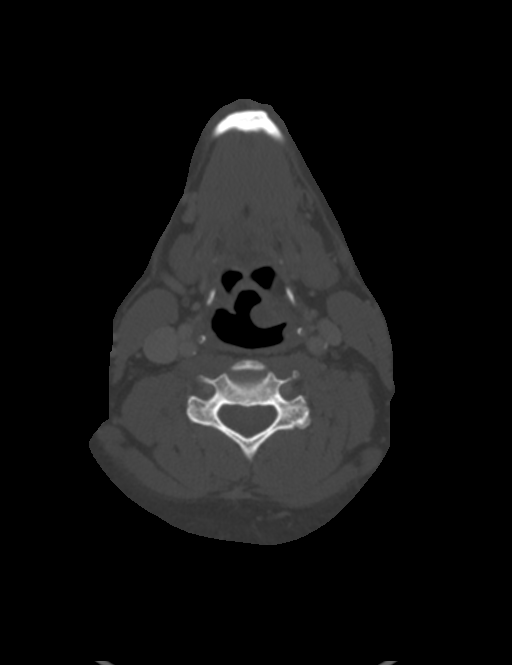
[im 115/144  bone]
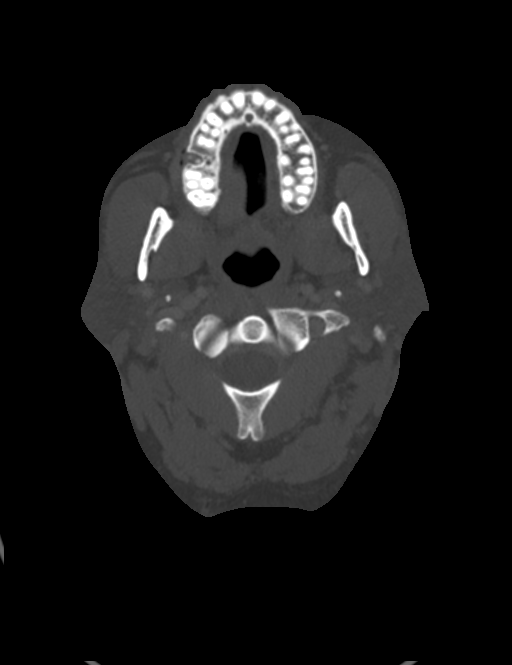

[12 of 33 positions shown; findings below may reference images not displayed]

FINDINGS: Pharynx and larynx: The nasopharynx and oropharynx soft tissue
contours are within normal limits. There are mild postinflammatory
dystrophic calcifications of the right palatine tonsil. The tongue
base and epiglottis are normal.

At the hypopharynx there is left greater than right bulky soft
tissue swelling and/or edema of the aryepiglottic folds (series 2,
image 53). Bulky soft tissue thickening in the bilateral piriform
sinuses greater on the left (series 2, image 57), although no
hyperenhancement or discrete hyperenhancing mass.

The hypopharynx is gas distended above the level of soft tissue
thickening.

The base of the epiglottis and anterior commissure appear to remain
normal. The glottis is closed, with no other laryngeal soft tissue
abnormality identified. The laryngeal cartilages appear normal. The
subglottic trachea is normal.

The retropharyngeal space is normal. The parapharyngeal spaces are
normal. The cervical esophagus appears to remain normal caudal to
the soft tissue thickening.

Salivary glands: Negative sublingual space. Bilateral submandibular
and parotid glands are symmetric and normal.

Thyroid: Negative.

Lymph nodes: No cervical lymphadenopathy. No soft tissue
inflammation identified.

Vascular: The major vascular structures in the neck and at the skull
base are patent with mild carotid atherosclerosis.

Limited intracranial: Negative.

Visualized orbits: Negative.

Mastoids and visualized paranasal sinuses: Clear.

Skeleton: Occasional carious dentition. Lower cervical spine mild
disc and endplate degeneration. No acute osseous abnormality
identified.

Upper chest: Left chest cardiac pacemaker device with subclavian
approach leads. No superior mediastinal lymphadenopathy. Normal lung
apices.
IMPRESSION: 1. Bulky soft tissue mass or swelling at both aryepiglottic folds
and piriform sinuses, worse on the left.
The epiglottis and remaining larynx appear normal.
The soft tissue enlargement is heterogeneous, but without discrete
hyperenhancing mass. No lymphadenopathy.
The other deep soft tissue spaces of the neck are normal.
Direct visualization by ENT is recommended to further characterize.
At this point a non-neoplastic process is suspected, with
noninfectious inflammation favored. If the patient is on an
Ace-inhibitor then Angioedema is most likely.
2. Normal CT appearance of the neck elsewhere. Negative visible
upper chest.

## 2019-07-14 IMAGING — CR DG CHEST 2V
2 series · 2 of 2 positions shown · non-contrast
Comparison: 02/08/2008

CLINICAL DATA: Short of breath

EXAM:
CHEST - 2 VIEW

[chest pa]
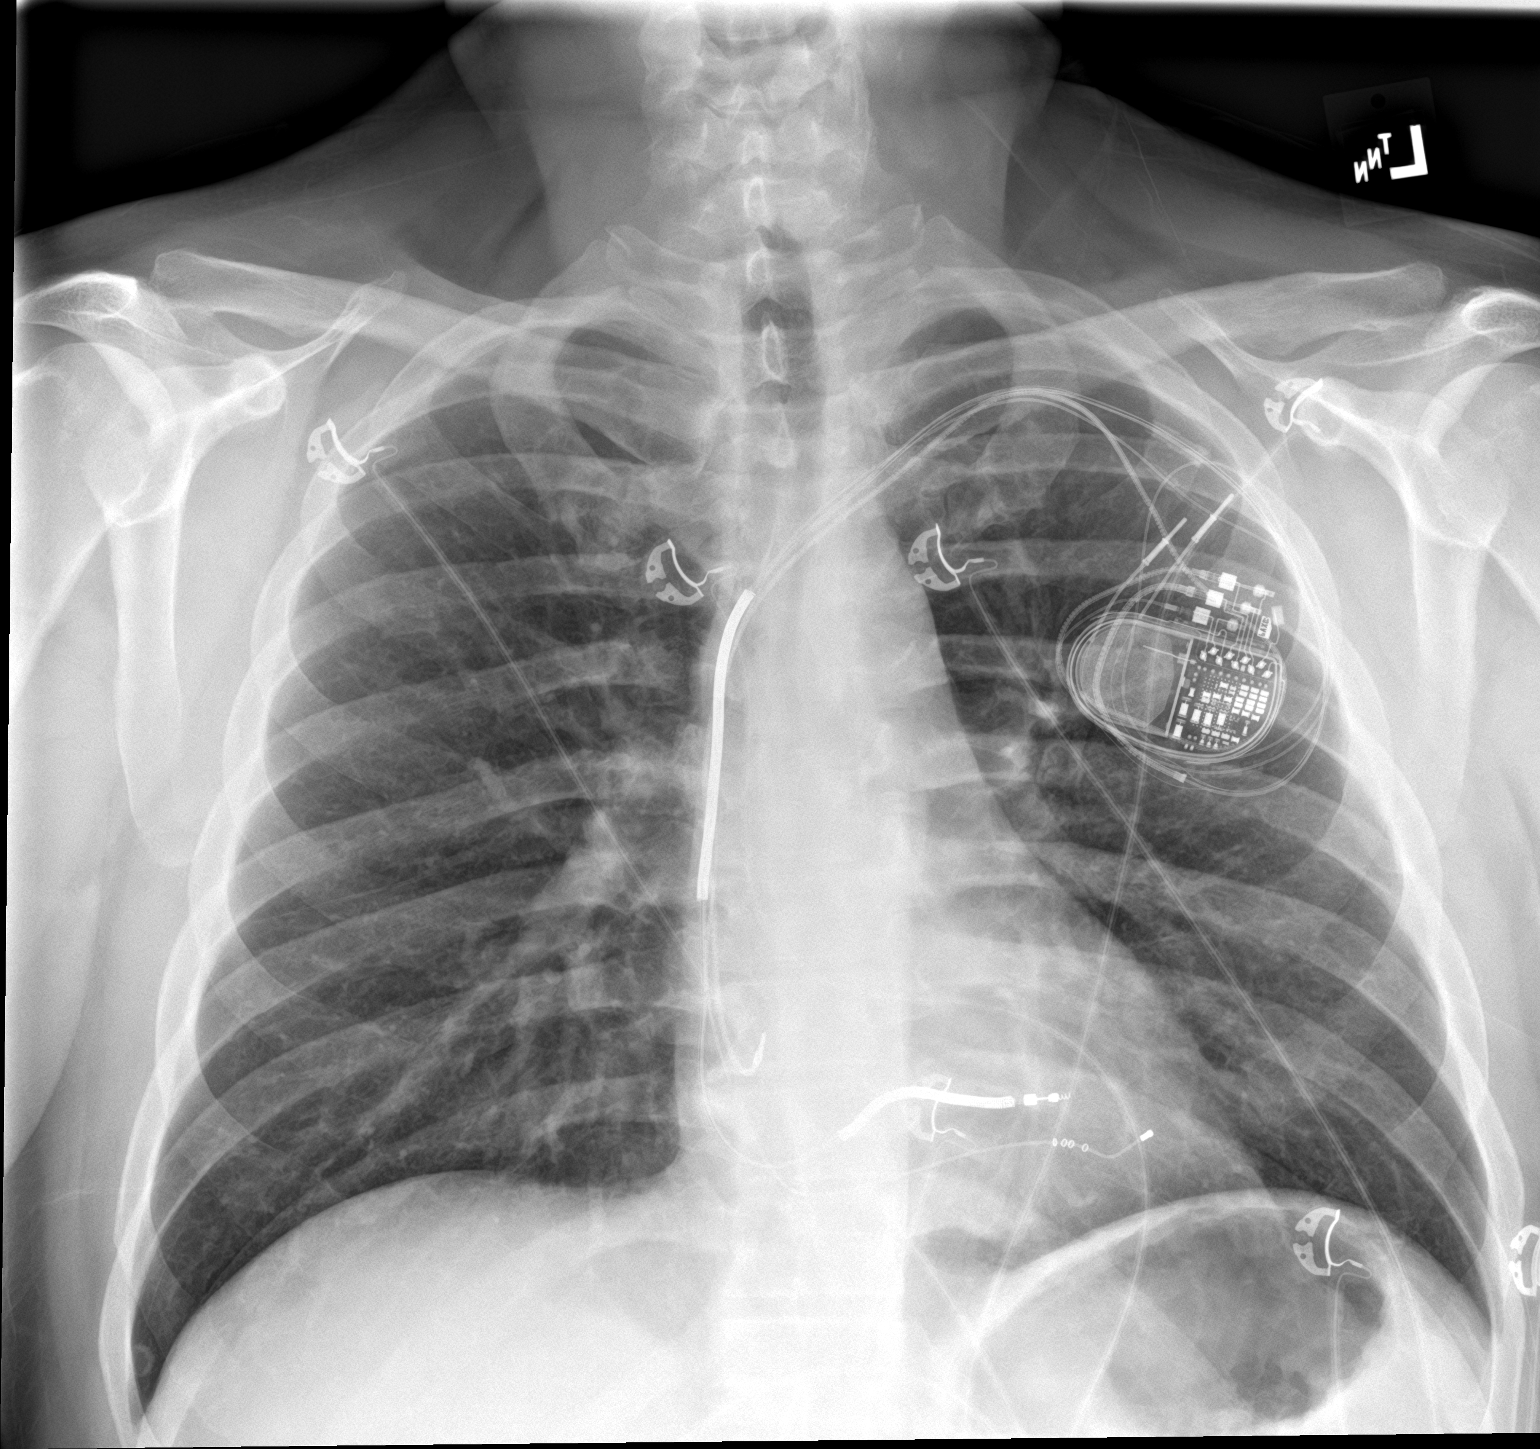

[chest lat]
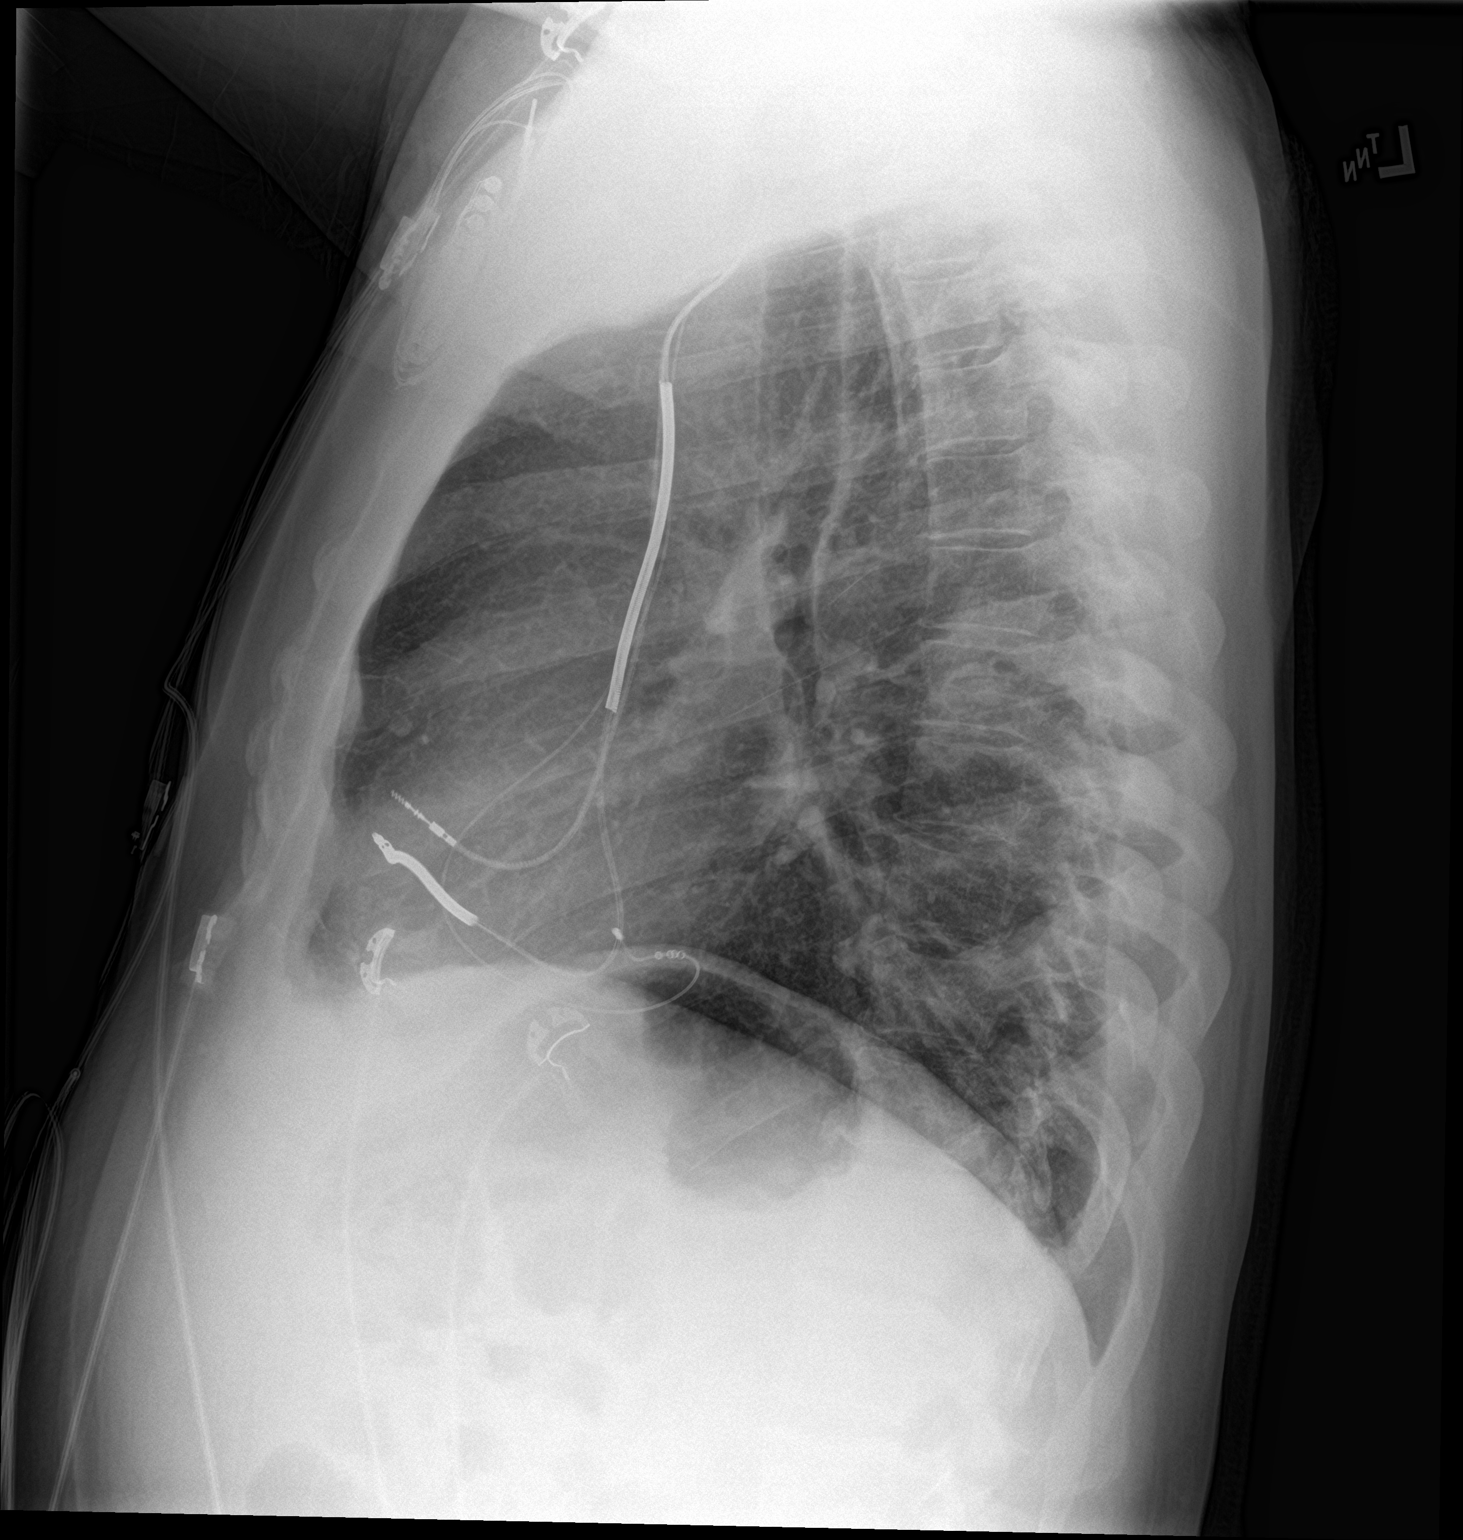

[2 of 2 positions shown; findings below may reference images not displayed]

FINDINGS: LEFT-sided pacemaker with continuous leads overlies normal cardiac
silhouette. Lungs are clear. Normal pulmonary vasculature. No
effusion, infiltrate pneumothorax. No acute osseous abnormality.
IMPRESSION: No acute cardiopulmonary process.

## 2019-09-15 ENCOUNTER — Encounter: Payer: Self-pay | Admitting: Physician Assistant

## 2019-09-21 NOTE — Progress Notes (Signed)
Patient: Cody [name], Male    DOB: 03/09/1971, 48 y.o.   MRN: [id number] Visit Date: 09/27/2019  Today's Provider: Mar Daring, PA-C   Chief Complaint  Patient presents with  . Annual Exam   Subjective:     Medicare wellness visit Cody [name] is a 48 y.o. male. He feels well today and has no concerns to address. He reports exercising by walking 5x a week. He reports he is sleeping well. -----------------------------------------------------------   Review of Systems  Constitutional: Negative.   HENT: Positive for congestion.   Eyes: Negative.   Respiratory: Negative.   Cardiovascular: Negative.   Gastrointestinal: Negative.   Endocrine: Negative.   Genitourinary: Negative.   Musculoskeletal: Negative.   Skin: Negative.   Allergic/Immunologic: Negative.   Neurological: Positive for dizziness.  Hematological: Negative.   Psychiatric/Behavioral: Negative.     Social History   Socioeconomic History  . Marital status: Married    Spouse name: Not on file  . Number of children: Not on file  . Years of education: Not on file  . Highest education level: Not on file  Occupational History  . Not on file  Social Needs  . Financial resource strain: Not on file  . Food insecurity    Worry: Not on file    Inability: Not on file  . Transportation needs    Medical: Not on file    Non-medical: Not on file  Tobacco Use  . Smoking status: Never Smoker  . Smokeless tobacco: Never Used  Substance and Sexual Activity  . Alcohol use: Yes    Comment: occasional  . Drug use: No  . Sexual activity: Yes    Partners: Female    Birth control/protection: None  Lifestyle  . Physical activity    Days per week: Not on file    Minutes per session: Not on file  . Stress: Not on file  Relationships  . Social Herbalist on phone: Not on file    Gets together: Not on file    Attends religious service: Not on file    Active member of club or organization:  Not on file    Attends meetings of clubs or organizations: Not on file    Relationship status: Not on file  . Intimate partner violence    Fear of current or ex partner: Not on file    Emotionally abused: Not on file    Physically abused: Not on file    Forced sexual activity: Not on file  Other Topics Concern  . Not on file  Social History Narrative  . Not on file    Past Medical History:  Diagnosis Date  . AICD (automatic cardioverter/defibrillator) present   . Cardiomyopathy, nonischemic ([place])   . Hyperlipemia   . Hypertension   . Presence of permanent cardiac pacemaker      Patient Active Problem List   Diagnosis Date Noted  . Pharyngitis 05/29/2018  . Chronic systolic heart failure ([place]) 04/10/2017  . NSVT (nonsustained ventricular tachycardia) ([place]) 04/10/2017  . AICD (automatic cardioverter/defibrillator) present 04/10/2017  . Well controlled diabetes mellitus ([place]) 05/16/2016  . Gout 05/16/2016  . HLD (hyperlipidemia) 05/16/2016  . Benign hypertension 05/16/2016  . Cardiomyopathy ([place]) 05/16/2016  . Adiposity 05/16/2016  . Cardiac pacemaker in situ 03/03/2012  . Left bundle branch block 09/03/2011  . Nonischemic dilated cardiomyopathy ([place]) 09/03/2011    Past Surgical History:  Procedure Laterality Date  . CARDIAC  SURGERY     pacemaker placed in 2010, in 2014 batteries were replased  . INSERT / REPLACE / REMOVE PACEMAKER      His family history includes Alzheimer's disease in his paternal grandmother; Cirrhosis in his maternal grandfather and paternal grandfather; Heart disease in his maternal grandmother and mother.   Current Outpatient Medications:  .  allopurinol (ZYLOPRIM) 100 MG tablet, Take 300 mg by mouth daily. , Disp: , Rfl:  .  aspirin 81 MG tablet, Take 81 mg by mouth daily. , Disp: , Rfl:  .  blood glucose meter kit and supplies KIT, Dispense based on patient and insurance preference. Use up to four times daily as directed. (FOR ICD-9 250.00,  250.01)., Disp: 1 each, Rfl: 0 .  carvedilol (COREG) 3.125 MG tablet, Take 3.125 mg by mouth 2 (two) times daily with a meal. , Disp: , Rfl:  .  digoxin (LANOXIN) 0.25 MG tablet, Take 0.25 mg by mouth daily., Disp: , Rfl:  .  ENTRESTO 24-26 MG, Take 1 tablet by mouth 2 (two) times daily. , Disp: , Rfl: 0 .  furosemide (LASIX) 20 MG tablet, Take 40 mg by mouth daily. , Disp: , Rfl:  .  potassium chloride SA (K-DUR) 20 MEQ tablet, One tablet daily, Disp: 90 tablet, Rfl: 1  Patient Care Team: Mar Daring, PA-C as PCP - General (Family Medicine)    Objective:    Vitals: BP 117/81   Pulse 92   Temp (!) 96.8 F (36 C) (Oral)   Resp 16   Ht '5\' 9"'  (1.753 m)   Wt 220 lb 3.2 oz (99.9 kg)   SpO2 98%   BMI 32.52 kg/m   Physical Exam Vitals signs reviewed.  Constitutional:      Appearance: Normal appearance. He is well-developed.  HENT:     Head: Normocephalic and atraumatic.     Right Ear: Tympanic membrane, ear canal and external ear normal.     Left Ear: Tympanic membrane, ear canal and external ear normal.     Nose: Nose normal.     Mouth/Throat:     Mouth: Mucous membranes are moist.  Eyes:     General: No scleral icterus.       Right eye: No discharge.        Left eye: No discharge.     Extraocular Movements: Extraocular movements intact.     Conjunctiva/sclera: Conjunctivae normal.     Pupils: Pupils are equal, round, and reactive to light.  Neck:     Musculoskeletal: Normal range of motion and neck supple.     Thyroid: No thyromegaly.     Vascular: No carotid bruit.     Trachea: No tracheal deviation.  Cardiovascular:     Rate and Rhythm: Normal rate and regular rhythm.     Pulses: Normal pulses.     Heart sounds: Normal heart sounds. No murmur.  Pulmonary:     Effort: Pulmonary effort is normal. No respiratory distress.     Breath sounds: Normal breath sounds. No wheezing or rales.  Chest:     Chest wall: No tenderness.  Abdominal:     General: Abdomen is  flat. Bowel sounds are normal. There is no distension.     Palpations: Abdomen is soft. There is no mass.     Tenderness: There is no abdominal tenderness. There is no guarding or rebound.     Hernia: A hernia (umbilical) is present.  Musculoskeletal: Normal range of motion.  General: No tenderness.     Right lower leg: No edema.     Left lower leg: No edema.  Lymphadenopathy:     Cervical: No cervical adenopathy.  Skin:    General: Skin is warm and dry.     Capillary Refill: Capillary refill takes less than 2 seconds.     Findings: No erythema or rash.  Neurological:     General: No focal deficit present.     Mental Status: He is alert and oriented to person, place, and time. Mental status is at baseline.     Cranial Nerves: No cranial nerve deficit.     Motor: No abnormal muscle tone.     Coordination: Coordination normal.     Deep Tendon Reflexes: Reflexes are normal and symmetric. Reflexes normal.  Psychiatric:        Mood and Affect: Mood normal.        Behavior: Behavior normal.        Thought Content: Thought content normal.        Judgment: Judgment normal.     Activities of Daily Living In your present state of health, do you have any difficulty performing the following activities: 09/27/2019  Hearing? N  Vision? Y  Difficulty concentrating or making decisions? N  Walking or climbing stairs? N  Dressing or bathing? N  Doing errands, shopping? N  Some recent data might be hidden    Fall Risk Assessment Fall Risk  09/27/2019  Falls in the past year? 0  Number falls in past yr: 0  Injury with Fall? 0     Depression Screen PHQ 2/9 Scores 09/27/2019  PHQ - 2 Score 0  PHQ- 9 Score 0    No flowsheet data found.    Assessment & Plan:     Annual Wellness Visit  Reviewed patient's Family Medical History Reviewed and updated list of patient's medical providers Assessment of cognitive impairment was done Assessed patient's functional ability  Established a written schedule for health screening [place] Completed and Reviewed  Exercise Activities and Dietary recommendations Goals   None      There is no immunization history on file for this patient.  Health Maintenance  Topic Date Due  . PNEUMOCOCCAL POLYSACCHARIDE VACCINE AGE 46-64 HIGH RISK  07/15/1975  . FOOT EXAM  07/15/1983  . OPHTHALMOLOGY EXAM  07/15/1983  . URINE MICROALBUMIN  07/15/1983  . HIV Screening  07/14/1988  . TETANUS/TDAP  07/14/1992  . INFLUENZA VACCINE  02/16/2020 (Originally 06/19/2019)  . HEMOGLOBIN A1C  12/15/2019     Discussed health benefits of physical activity, and encouraged him to engage in regular exercise appropriate for his age and condition.    1. Medicare annual wellness visit, subsequent Normal physical exam today. Will check labs as below and f/u pending lab results. If labs are stable and WNL he will not need to have these rechecked for one year at his next annual physical exam. He is to call the office in the meantime if he has any acute issue, questions or concerns. - CBC w/Diff/Platelet - Comprehensive Metabolic Panel (CMET) - Lipid Profile - HgB A1c  2. Colon cancer screening Due for colon cancer screening. Cologuard ordered as below.  - Cologuard  3. Prostate cancer screening No symptoms. Will check labs as below and f/u pending results. - PSA  4. Benign hypertension Stable. Continue Entresto, carvedilol, digoxin and furosemide. Will check labs as below and f/u pending results. - CBC w/Diff/Platelet - Comprehensive Metabolic  Panel (CMET) - Lipid Profile - HgB A1c  5. Well controlled diabetes mellitus ([place]) Diet controlled. Will check labs as below and f/u pending results. - CBC w/Diff/Platelet - Comprehensive Metabolic Panel (CMET) - Lipid Profile - HgB A1c  6. Mixed hyperlipidemia Diet controlled. Will check labs as below and f/u pending results. - CBC w/Diff/Platelet - Comprehensive  Metabolic Panel (CMET) - Lipid Profile - HgB A1c  7. Nonischemic dilated cardiomyopathy (HCC) Stable. Followed by Cardiology. Has pacemaker/ICD in place.  - Comprehensive Metabolic Panel (CMET)  8. Umbilical hernia without obstruction and without gangrene Occasionally gets pain and the hernia will get hard. Never reduces. Always feels something but not always painful. With his cardiac history he would like to see if possible to have repaired or if the risks are too great.  - Ambulatory referral to General Surgery  ------------------------------------------------------------------------------------------------------------   Mar Daring, PA-C  [facility]  Fritzi Mandes [place] as a scribe for Mar Daring, PA-C.,have documented all relevant documentation on the behalf of Mar Daring, PA-C,as directed by  Mar Daring, PA-C while in the presence of Mar Daring, [place].

## 2019-09-27 ENCOUNTER — Ambulatory Visit (INDEPENDENT_AMBULATORY_CARE_PROVIDER_SITE_OTHER): Payer: Medicare Other | Admitting: Physician Assistant

## 2019-09-27 ENCOUNTER — Encounter: Payer: Self-pay | Admitting: Physician Assistant

## 2019-09-27 ENCOUNTER — Other Ambulatory Visit: Payer: Self-pay

## 2019-09-27 VITALS — BP 117/81 | HR 92 | Temp 96.8°F | Resp 16 | Ht 69.0 in | Wt 220.2 lb

## 2019-09-27 DIAGNOSIS — K429 Umbilical hernia without obstruction or gangrene: Secondary | ICD-10-CM

## 2019-09-27 DIAGNOSIS — I420 Dilated cardiomyopathy: Secondary | ICD-10-CM

## 2019-09-27 DIAGNOSIS — I10 Essential (primary) hypertension: Secondary | ICD-10-CM | POA: Diagnosis not present

## 2019-09-27 DIAGNOSIS — Z1211 Encounter for screening for malignant neoplasm of colon: Secondary | ICD-10-CM | POA: Diagnosis not present

## 2019-09-27 DIAGNOSIS — E119 Type 2 diabetes mellitus without complications: Secondary | ICD-10-CM

## 2019-09-27 DIAGNOSIS — Z0000 Encounter for general adult medical examination without abnormal findings: Secondary | ICD-10-CM

## 2019-09-27 DIAGNOSIS — E782 Mixed hyperlipidemia: Secondary | ICD-10-CM

## 2019-09-27 DIAGNOSIS — Z125 Encounter for screening for malignant neoplasm of prostate: Secondary | ICD-10-CM | POA: Diagnosis not present

## 2019-09-27 NOTE — Patient Instructions (Signed)

## 2019-09-28 ENCOUNTER — Telehealth: Payer: Self-pay

## 2019-09-28 DIAGNOSIS — E7800 Pure hypercholesterolemia, unspecified: Secondary | ICD-10-CM

## 2019-09-28 LAB — COMPREHENSIVE METABOLIC PANEL
ALT: 49 IU/L — ABNORMAL HIGH (ref 0–44)
AST: 57 IU/L — ABNORMAL HIGH (ref 0–40)
Albumin/Globulin Ratio: 1.1 — ABNORMAL LOW (ref 1.2–2.2)
Albumin: 4.2 g/dL (ref 4.0–5.0)
Alkaline Phosphatase: 52 IU/L (ref 39–117)
BUN/Creatinine Ratio: 8 — ABNORMAL LOW (ref 9–20)
BUN: 8 mg/dL (ref 6–24)
Bilirubin Total: 1.4 mg/dL — ABNORMAL HIGH (ref 0.0–1.2)
CO2: 30 mmol/L — ABNORMAL HIGH (ref 20–29)
Calcium: 8.9 mg/dL (ref 8.7–10.2)
Chloride: 88 mmol/L — ABNORMAL LOW (ref 96–106)
Creatinine, Ser: 0.97 mg/dL (ref 0.76–1.27)
GFR calc Af Amer: 108 mL/min/{1.73_m2} (ref >59)
GFR calc non Af Amer: 93 mL/min/{1.73_m2} (ref >59)
Globulin, Total: 3.9 g/dL (ref 1.5–4.5)
Glucose: 100 mg/dL — ABNORMAL HIGH (ref 65–99)
Potassium: 2.6 mmol/L — ABNORMAL LOW (ref 3.5–5.2)
Sodium: 136 mmol/L (ref 134–144)
Total Protein: 8.1 g/dL (ref 6.0–8.5)

## 2019-09-28 LAB — HEMOGLOBIN A1C
Est. average glucose Bld gHb Est-mCnc: 140 mg/dL
Hgb A1c MFr Bld: 6.5 % — ABNORMAL HIGH (ref 4.8–5.6)

## 2019-09-28 LAB — LIPID PANEL
Chol/HDL Ratio: 5.1 ratio — ABNORMAL HIGH (ref 0.0–5.0)
Cholesterol, Total: 179 mg/dL (ref 100–199)
HDL: 35 mg/dL — ABNORMAL LOW (ref >39)
LDL Chol Calc (NIH): 120 mg/dL — ABNORMAL HIGH (ref 0–99)
Triglycerides: 131 mg/dL (ref 0–149)
VLDL Cholesterol Cal: 24 mg/dL (ref 5–40)

## 2019-09-28 LAB — CBC WITH DIFFERENTIAL/PLATELET
Basophils Absolute: 0.2 10*3/uL (ref 0.0–0.2)
Basos: 1 %
EOS (ABSOLUTE): 0.3 10*3/uL (ref 0.0–0.4)
Eos: 2 %
Hematocrit: 49.4 % (ref 37.5–51.0)
Hemoglobin: 17.6 g/dL (ref 13.0–17.7)
Immature Grans (Abs): 0 10*3/uL (ref 0.0–0.1)
Immature Granulocytes: 0 %
Lymphocytes Absolute: 3.2 10*3/uL — ABNORMAL HIGH (ref 0.7–3.1)
Lymphs: 30 %
MCH: 33.7 pg — ABNORMAL HIGH (ref 26.6–33.0)
MCHC: 35.6 g/dL (ref 31.5–35.7)
MCV: 95 fL (ref 79–97)
Monocytes Absolute: 1.1 10*3/uL — ABNORMAL HIGH (ref 0.1–0.9)
Monocytes: 11 %
Neutrophils Absolute: 5.9 10*3/uL (ref 1.4–7.0)
Neutrophils: 56 %
Platelets: 152 10*3/uL (ref 150–450)
RBC: 5.23 x10E6/uL (ref 4.14–5.80)
RDW: 12.2 % (ref 11.6–15.4)
WBC: 10.6 10*3/uL (ref 3.4–10.8)

## 2019-09-28 LAB — PSA: Prostate Specific Ag, Serum: 0.5 ng/mL (ref 0.0–4.0)

## 2019-09-28 NOTE — Telephone Encounter (Signed)
NA

## 2019-09-28 NOTE — Telephone Encounter (Signed)
-----   Message from Mar Daring, [place] sent at 09/28/2019  1:27 PM EST ----- Blood count is normal and stable. Kidney function is normal. Liver enzymes are up just slightly but I do not have any to compare to. Normally I tell people to limit fatty foods in diet, tylenol (acetaminophen) based products and alcohol. We can recheck these labs in 3 months if desired. Also of note potassium is low at 2.6. Would recommend to take 2 potassium tabs x 3 days and we recheck potassium in 1-2 weeks. Cholesterol levels are borderline elevated as well. Again limiting fatty foods, processed foods, red meats in diet and increasing physical activity to try to get 150 min of moderate activity per week can help. With your cardiac history it may also be beneficial to consider a cholesterol lowering medication as well. If agreeable I can send in. A1c has also increased from 5.9 to 6.5. PSA levels are normal.

## 2019-10-01 MED ORDER — ATORVASTATIN CALCIUM 20 MG PO TABS: 20.0000 mg | ORAL_TABLET | Freq: Every day | ORAL | 3 refills | Status: AC

## 2019-10-01 NOTE — Telephone Encounter (Signed)
Patient advised as directed below. Patient agreed to a lowering medication. Pharmacy: [facility] in [place].

## 2019-10-01 NOTE — Telephone Encounter (Signed)
Sent in Atorvastatin 20mg .

## 2019-10-04 ENCOUNTER — Telehealth: Payer: Self-pay | Admitting: Surgery

## 2019-10-04 ENCOUNTER — Ambulatory Visit: Payer: Self-pay | Admitting: Surgery

## 2019-10-04 NOTE — Telephone Encounter (Signed)
Patient's wife called and cancelled appointment for 10-04-2019 at 3:30pm with Dr. Dahlia Byes. Patient is not feeling well and his wife stated that they would call back and reschedule at a later time.

## 2019-11-17 ENCOUNTER — Encounter: Payer: Self-pay | Admitting: *Deleted

## 2019-12-20 ENCOUNTER — Other Ambulatory Visit: Payer: Self-pay | Admitting: Physician Assistant

## 2019-12-20 MED ORDER — POTASSIUM CHLORIDE CRYS ER 20 MEQ PO TBCR: EXTENDED_RELEASE_TABLET | ORAL | 1 refills | Status: DC

## 2019-12-20 NOTE — Telephone Encounter (Signed)
Requested medication (s) are due for refill today - yes  Requested medication (s) are on the active medication list -yes  Future visit scheduled - yes  Last refill: written 03/22/19  Notes to clinic: Patient is requesting refill of K+- sent for review. Patient is also requesting metformin and glipizide- not on medication list  Requested Prescriptions  Pending Prescriptions Disp Refills   potassium chloride SA (KLOR-CON) 20 MEQ tablet 90 tablet 1    Sig: One tablet daily      Endocrinology:  Minerals - Potassium Supplementation Failed - 12/20/2019  1:26 PM      Failed - K in normal range and within 360 days    Potassium  Date Value Ref Range Status  09/27/2019 2.6 (L) 3.5 - 5.2 mmol/L Final          Passed - Cr in normal range and within 360 days    Creatinine, Ser  Date Value Ref Range Status  09/27/2019 0.97 0.76 - 1.27 mg/dL Final          Passed - Valid encounter within last 12 months    Recent Outpatient Visits           6 months ago Diabetes mellitus without complication [facility])   [facility] [place], [place]   11 months ago Diabetes mellitus without complication [facility])   [facility] [place], [place], [place]   1 year ago Diabetes mellitus without complication [facility])   [facility] [place], [place], [place]   1 year ago Diabetes mellitus without complication [facility])   [place], [place]   1 year ago Diabetes mellitus without complication ([place])   [facility] [place], [place], [place]       Future Appointments             In 3 months [place], Clearnce Sorrel, PA-C [employer], [place]                Requested Prescriptions  Pending Prescriptions Disp Refills   potassium chloride SA (KLOR-CON) 20 MEQ tablet 90 tablet 1    Sig: One tablet daily      Endocrinology:  Minerals - Potassium Supplementation Failed - 12/20/2019  1:26 PM      Failed - K in normal range and within 360  days    Potassium  Date Value Ref Range Status  09/27/2019 2.6 (L) 3.5 - 5.2 mmol/L Final          Passed - Cr in normal range and within 360 days    Creatinine, Ser  Date Value Ref Range Status  09/27/2019 0.97 0.76 - 1.27 mg/dL Final          Passed - Valid encounter within last 12 months    Recent Outpatient Visits           6 months ago Diabetes mellitus without complication [facility])   [facility] [place], [place]   11 months ago Diabetes mellitus without complication [facility])   [place], [place]   1 year ago Diabetes mellitus without complication [facility])   [place], [place]   1 year ago Diabetes mellitus without complication [facility]   [place], [place]   1 year ago Diabetes mellitus without complication [facility])   [facility] [place], [place], [place]       Future Appointments             In 3  months Mar Daring, PA-C [employer], [facility]

## 2019-12-20 NOTE — Telephone Encounter (Signed)
Medication Refill - Medication: potassium, metformin, glipizide  Has the patient contacted their pharmacy? Yes.   pt does not have anymore potassium. Please advise.  (Agent: If no, request that the patient contact the pharmacy for the refill.) (Agent: If yes, when and what did the pharmacy advise?)  Preferred Pharmacy (with phone number or street name):  [place], [address] [place] [address]  Phone: [phone] Fax: [phone]  Not a 24 hour pharmacy; exact hours not known.     Agent: Please be advised that RX refills may take up to 3 business days. We ask that you follow-up with your pharmacy.

## 2019-12-21 ENCOUNTER — Other Ambulatory Visit: Payer: Self-pay | Admitting: Physician Assistant

## 2019-12-21 NOTE — Telephone Encounter (Signed)
This was done yesterday.  

## 2019-12-21 NOTE — Telephone Encounter (Signed)
[place] faxed refill request for the following medications: [facility] [phone] - phone [phone] - fax  potassium chloride SA (KLOR-CON) 20 MEQ tablet    Please advise.  Thanks, [employer]

## 2019-12-23 ENCOUNTER — Encounter: Payer: Self-pay | Admitting: *Deleted

## 2020-03-01 ENCOUNTER — Emergency Department: Payer: Medicare Other

## 2020-03-01 ENCOUNTER — Encounter: Payer: Self-pay | Admitting: Emergency Medicine

## 2020-03-01 ENCOUNTER — Emergency Department
Admission: EM | Admit: 2020-03-01 | Discharge: 2020-03-01 | Disposition: A | Discharge status: Home / Self Care | Payer: Medicare Other | Attending: Emergency Medicine | Admitting: Emergency Medicine

## 2020-03-01 ENCOUNTER — Other Ambulatory Visit: Payer: Self-pay

## 2020-03-01 DIAGNOSIS — I10 Essential (primary) hypertension: Secondary | ICD-10-CM | POA: Diagnosis not present

## 2020-03-01 DIAGNOSIS — R1012 Left upper quadrant pain: Secondary | ICD-10-CM | POA: Diagnosis present

## 2020-03-01 DIAGNOSIS — N3001 Acute cystitis with hematuria: Secondary | ICD-10-CM

## 2020-03-01 DIAGNOSIS — Z7982 Long term (current) use of aspirin: Secondary | ICD-10-CM | POA: Diagnosis not present

## 2020-03-01 DIAGNOSIS — Z95810 Presence of automatic (implantable) cardiac defibrillator: Secondary | ICD-10-CM | POA: Insufficient documentation

## 2020-03-01 DIAGNOSIS — Z950 Presence of cardiac pacemaker: Secondary | ICD-10-CM | POA: Diagnosis not present

## 2020-03-01 DIAGNOSIS — Z79899 Other long term (current) drug therapy: Secondary | ICD-10-CM | POA: Diagnosis not present

## 2020-03-01 LAB — COMPREHENSIVE METABOLIC PANEL
ALT: 30 U/L (ref 0–44)
AST: 46 U/L — ABNORMAL HIGH (ref 15–41)
Albumin: 3.5 g/dL (ref 3.5–5.0)
Alkaline Phosphatase: 35 U/L — ABNORMAL LOW (ref 38–126)
Anion gap: 11 (ref 5–15)
BUN: 9 mg/dL (ref 6–20)
CO2: 27 mmol/L (ref 22–32)
Calcium: 7.9 mg/dL — ABNORMAL LOW (ref 8.9–10.3)
Chloride: 97 mmol/L — ABNORMAL LOW (ref 98–111)
Creatinine, Ser: 1.02 mg/dL (ref 0.61–1.24)
GFR calc Af Amer: >60 mL/min (ref >60)
GFR calc non Af Amer: >60 mL/min (ref >60)
Glucose, Bld: 134 mg/dL — ABNORMAL HIGH (ref 70–99)
Potassium: 3.5 mmol/L (ref 3.5–5.1)
Sodium: 135 mmol/L (ref 135–145)
Total Bilirubin: 1.7 mg/dL — ABNORMAL HIGH (ref 0.3–1.2)
Total Protein: 8 g/dL (ref 6.5–8.1)

## 2020-03-01 LAB — CBC
HCT: 45.4 % (ref 39.0–52.0)
Hemoglobin: 15.7 g/dL (ref 13.0–17.0)
MCH: 34.1 pg — ABNORMAL HIGH (ref 26.0–34.0)
MCHC: 34.6 g/dL (ref 30.0–36.0)
MCV: 98.7 fL (ref 80.0–100.0)
Platelets: 157 10*3/uL (ref 150–400)
RBC: 4.6 MIL/uL (ref 4.22–5.81)
RDW: 11.8 % (ref 11.5–15.5)
WBC: 9.3 10*3/uL (ref 4.0–10.5)
nRBC: 0 % (ref 0.0–0.2)

## 2020-03-01 LAB — URINALYSIS, COMPLETE (UACMP) WITH MICROSCOPIC
Bilirubin Urine: NEGATIVE
Glucose, UA: NEGATIVE mg/dL
Ketones, ur: NEGATIVE mg/dL
Leukocytes,Ua: NEGATIVE
Nitrite: NEGATIVE
Protein, ur: 100 mg/dL — AB
RBC / HPF: >50 RBC/hpf — ABNORMAL HIGH (ref 0–5)
Specific Gravity, Urine: 1.013 (ref 1.005–1.030)
pH: 6 (ref 5.0–8.0)

## 2020-03-01 LAB — TROPONIN I (HIGH SENSITIVITY)
Troponin I (High Sensitivity): 20 ng/L — ABNORMAL HIGH (ref <18)
Troponin I (High Sensitivity): 20 ng/L — ABNORMAL HIGH (ref <18)

## 2020-03-01 LAB — LIPASE, BLOOD: Lipase: 40 U/L (ref 11–51)

## 2020-03-01 MED ORDER — HYDROCODONE-ACETAMINOPHEN 5-325 MG PO TABS: 1.0000 | ORAL_TABLET | Freq: Four times a day (QID) | ORAL | 0 refills | Status: DC | PRN

## 2020-03-01 MED ORDER — CEFDINIR 300 MG PO CAPS
300.0000 mg | ORAL_CAPSULE | Freq: Once | ORAL | Status: AC
  Administered 2020-03-01: 300 mg via ORAL
  Filled 2020-03-01: qty 1

## 2020-03-01 MED ORDER — ONDANSETRON 4 MG PO TBDP: 4.0000 mg | ORAL_TABLET | Freq: Three times a day (TID) | ORAL | 0 refills | Status: DC | PRN

## 2020-03-01 MED ORDER — OXYCODONE-ACETAMINOPHEN 5-325 MG PO TABS
2.0000 | ORAL_TABLET | Freq: Once | ORAL | Status: AC
  Administered 2020-03-01: 2 via ORAL
  Filled 2020-03-01: qty 2

## 2020-03-01 MED ORDER — ONDANSETRON 4 MG PO TBDP
4.0000 mg | ORAL_TABLET | Freq: Once | ORAL | Status: AC
  Administered 2020-03-01: 4 mg via ORAL
  Filled 2020-03-01: qty 1

## 2020-03-01 MED ORDER — CEFDINIR 300 MG PO CAPS: 300.0000 mg | ORAL_CAPSULE | Freq: Two times a day (BID) | ORAL | 0 refills | Status: AC

## 2020-03-01 NOTE — ED Triage Notes (Addendum)
Pt c/o LUQ pain for 5 days.  Denies any pain in chest.  Pain has been since Friday.  No NVD. No fevers. VSS.  Pain is constant and but worse at times.  Decreased appetite.  Ambulatory. Color WNL. Pt also having flank pain.  Hematuria noted with urine specimen.

## 2020-03-01 NOTE — ED Provider Notes (Signed)
[facility] Emergency Department Provider Note  ____________________________________________   First MD Initiated Contact with Patient 03/01/20 1313     (approximate)  I have reviewed the triage vital signs and the nursing notes.   HISTORY  Chief Complaint Abdominal Pain    HPI Cody [name] is a 49 y.o. male  With h/o HTN, HLD, CM s/p AICD, here with left sided abd pain. Pt reports that sx started approx 1 week ago as gradual onset of aching, gnawing epigastric/LUQ abd pain, with associated nausea. These sx resolved over the next 2 days but returned 3 days ago, have persisted since then. Describes it as an aching, fullness feeling, worse with position changes and twisting. Feels like it could be a muscle. He has also noticed some blood in his urine over past 2 days which is new. No overt flank pain. No vomiting. No diarrhea, no change in bowel movements. No fever, chills. No h/o similar episodes. NO known h/o PUD. He does take Ibuprofen fairly regularly.        Past Medical History:  Diagnosis Date  . AICD (automatic cardioverter/defibrillator) present   . Cardiomyopathy, nonischemic ([place])   . Hyperlipemia   . Hypertension   . Presence of permanent cardiac pacemaker     Patient Active Problem List   Diagnosis Date Noted  . Pharyngitis 05/29/2018  . Chronic systolic heart failure ([place]) 04/10/2017  . NSVT (nonsustained ventricular tachycardia) ([place]) 04/10/2017  . AICD (automatic cardioverter/defibrillator) present 04/10/2017  . Well controlled diabetes mellitus ([place]) 05/16/2016  . Gout 05/16/2016  . HLD (hyperlipidemia) 05/16/2016  . Benign hypertension 05/16/2016  . Cardiomyopathy ([place]) 05/16/2016  . Adiposity 05/16/2016  . Cardiac pacemaker in situ 03/03/2012  . Left bundle branch block 09/03/2011  . Nonischemic dilated cardiomyopathy ([place]) 09/03/2011    Past Surgical History:  Procedure Laterality Date  . CARDIAC SURGERY     pacemaker  placed in 2010, in 2014 batteries were replased  . INSERT / REPLACE / REMOVE PACEMAKER      Prior to Admission medications   Medication Sig Start Date End Date Taking? Authorizing Provider  allopurinol (ZYLOPRIM) 100 MG tablet Take 300 mg by mouth daily.     [provider]  aspirin 81 MG tablet Take 81 mg by mouth daily.     [provider]  atorvastatin (LIPITOR) 20 MG tablet Take 1 tablet (20 mg total) by mouth daily. 10/01/19   Mar Daring, PA-C  blood glucose meter kit and supplies KIT Dispense based on patient and insurance preference. Use up to four times daily as directed. (FOR ICD-9 250.00, 250.01). 05/30/18   Dustin Flock, MD  carvedilol (COREG) 3.125 MG tablet Take 3.125 mg by mouth 2 (two) times daily with a meal.     [provider]  cefdinir (OMNICEF) 300 MG capsule Take 1 capsule (300 mg total) by mouth 2 (two) times daily for 10 days. 03/01/20 03/11/20  Duffy Bruce, MD  digoxin (LANOXIN) 0.25 MG tablet Take 0.25 mg by mouth daily.    [provider]  ENTRESTO 24-26 MG Take 1 tablet by mouth 2 (two) times daily.  02/13/17   [provider]  furosemide (LASIX) 20 MG tablet Take 40 mg by mouth daily.     [provider]  HYDROcodone-acetaminophen (NORCO/VICODIN) 5-325 MG tablet Take 1-2 tablets by mouth every 6 (six) hours as needed for moderate pain or severe pain. 03/01/20 03/01/21  Duffy Bruce, MD  ondansetron (ZOFRAN ODT) 4  MG disintegrating tablet Take 1 tablet (4 mg total) by mouth every 8 (eight) hours as needed for nausea or vomiting. 03/01/20   Duffy Bruce, MD  potassium chloride SA (KLOR-CON) 20 MEQ tablet One tablet daily 12/20/19   Mar Daring, PA-C    Allergies Patient has no known allergies.  Family History  Problem Relation Age of Onset  . Heart disease Mother   . Cirrhosis Maternal Grandfather   . Alzheimer's disease Paternal Grandmother   . Cirrhosis Paternal Grandfather   . Heart  disease Maternal Grandmother     Social History Social History   Tobacco Use  . Smoking status: Never Smoker  . Smokeless tobacco: Never Used  Substance Use Topics  . Alcohol use: Yes    Comment: occasional  . Drug use: No    Review of Systems  Review of Systems  Constitutional: Negative for chills, fatigue and fever.  HENT: Negative for sore throat.   Respiratory: Negative for shortness of breath.   Cardiovascular: Negative for chest pain.  Gastrointestinal: Positive for abdominal pain and nausea.  Genitourinary: Positive for flank pain and hematuria.  Musculoskeletal: Negative for neck pain.  Skin: Negative for rash and wound.  Allergic/Immunologic: Negative for immunocompromised state.  Neurological: Negative for weakness and numbness.  Hematological: Does not bruise/bleed easily.  All other systems reviewed and are negative.    ____________________________________________  PHYSICAL EXAM:      VITAL SIGNS: ED Triage Vitals  Enc Vitals Group     BP 03/01/20 1114 130/79     Pulse Rate 03/01/20 1113 92     Resp 03/01/20 1113 16     Temp 03/01/20 1113 98.4 F (36.9 C)     Temp Source 03/01/20 1113 Oral     SpO2 03/01/20 1113 99 %     Weight 03/01/20 1113 215 lb (97.5 kg)     Height 03/01/20 1113 '5\' 9"'  (1.753 m)     Head Circumference --      Peak Flow --      Pain Score 03/01/20 1113 8     Pain Loc --      Pain Edu? --      Excl. in [place]? --      Physical Exam Vitals and nursing note reviewed.  Constitutional:      General: He is not in acute distress.    Appearance: He is well-developed.  HENT:     Head: Normocephalic and atraumatic.  Eyes:     Conjunctiva/sclera: Conjunctivae normal.  Cardiovascular:     Rate and Rhythm: Normal rate and regular rhythm.     Heart sounds: Normal heart sounds.  Pulmonary:     Effort: Pulmonary effort is normal. No respiratory distress.     Breath sounds: No wheezing.  Abdominal:     General: Bowel sounds are normal.  There is no distension.     Tenderness: There is abdominal tenderness in the periumbilical area and left upper quadrant.     Comments: Soft, reducible umbilical hernia  Musculoskeletal:     Cervical back: Neck supple.  Skin:    General: Skin is warm.     Capillary Refill: Capillary refill takes less than 2 seconds.     Findings: No rash.  Neurological:     Mental Status: He is alert and oriented to person, place, and time.     Motor: No abnormal muscle tone.       ____________________________________________   LABS (all labs ordered are listed, but  only abnormal results are displayed)  Labs Reviewed  COMPREHENSIVE METABOLIC PANEL - Abnormal; Notable for the following components:      Result Value   Chloride 97 (*)    Glucose, Bld 134 (*)    Calcium 7.9 (*)    AST 46 (*)    Alkaline Phosphatase 35 (*)    Total Bilirubin 1.7 (*)    All other components within normal limits  CBC - Abnormal; Notable for the following components:   MCH 34.1 (*)    All other components within normal limits  URINALYSIS, COMPLETE (UACMP) WITH MICROSCOPIC - Abnormal; Notable for the following components:   Color, Urine AMBER (*)    APPearance HAZY (*)    Hgb urine dipstick LARGE (*)    Protein, ur 100 (*)    RBC / HPF >50 (*)    Bacteria, UA MANY (*)    All other components within normal limits  TROPONIN I (HIGH SENSITIVITY) - Abnormal; Notable for the following components:   Troponin I (High Sensitivity) 20 (*)    All other components within normal limits  TROPONIN I (HIGH SENSITIVITY) - Abnormal; Notable for the following components:   Troponin I (High Sensitivity) 20 (*)    All other components within normal limits  URINE CULTURE  LIPASE, BLOOD  TROPONIN I (HIGH SENSITIVITY)  TROPONIN I (HIGH SENSITIVITY)    ____________________________________________  EKG: Atrial sensed, ventricular paced rhythm.  Ventricular rate 83.  QRS 148, QTc 412.  No acute ST elevations or depressions.  No  significant change from prior. ________________________________________  RADIOLOGY All imaging, including plain films, CT scans, and ultrasounds, independently reviewed by me, and interpretations confirmed via formal radiology reads.  ED MD interpretation:   Chest x-ray: Clear CT stone: No acute abnormality CXR: CLear  Official radiology report(s): DG Chest 2 View  Result Date: 03/01/2020 CLINICAL DATA:  Left lower chest pain. EXAM: CHEST - 2 VIEW COMPARISON:  05/29/2018 chest radiograph. FINDINGS: Left chest pacing device with leads terminating over the right heart. Radiographically clear lungs. No pneumothorax or pleural effusion. Cardiomediastinal silhouette is unchanged No acute osseous abnormality.  Multilevel spondylosis. IMPRESSION: Clear lungs. Electronically Signed   By: Primitivo Gauze M.D.   On: 03/01/2020 14:20   CT Renal Stone Study  Result Date: 03/01/2020 CLINICAL DATA:  Flank pain, kidney stone suspected. EXAM: CT ABDOMEN AND PELVIS WITHOUT CONTRAST TECHNIQUE: Multidetector CT imaging of the abdomen and pelvis was performed following the standard protocol without IV contrast. COMPARISON:  11/16/2003 abdominal radiograph and ultrasound. FINDINGS: Please note that lack of intravenous contrast limits evaluation of the viscera and vasculature. Lower chest: Lung bases are clear. Partially imaged pacing leads terminating over the heart. Hepatobiliary: Peripheral predominant hepatic steatosis. No focal hepatic lesion. No biliary dilatation. Gallbladder is unremarkable. Pancreas: No focal lesions or pancreatic ductal dilatation. No surrounding inflammation. Spleen: Unremarkable. Adrenals/Urinary Tract: Adrenal glands are unremarkable. No focal renal lesion or calculi. No hydronephrosis. Bladder is unremarkable. Stomach/Bowel: Stomach is within normal limits. Appendix appears normal. No evidence of obstruction. No bowel wall thickening or inflammatory changes. No ascites.  Vascular/Lymphatic: Vasculature is within normal limits for patient's age. Scattered atherosclerotic calcifications involving the abdominal aorta and its branch vessels. No abdominopelvic adenopathy. Reproductive: Unremarkable. Other: Fat containing 5.0 cm umbilical hernia. External soft tissues are otherwise unremarkable. Musculoskeletal: Multilevel spondylosis with prominent inferior thoracic osteophytosis. No acute or focal osseous lesions. IMPRESSION: No evidence of nephrolithiasis or hydronephrosis. No acute abdominopelvic process. Hepatic steatosis and 5.0 cm fat  containing umbilical hernia. Electronically Signed   By: Primitivo Gauze M.D.   On: 03/01/2020 14:35    ____________________________________________  PROCEDURES   Procedure(s) performed (including Critical Care):  Procedures  ____________________________________________  INITIAL IMPRESSION / MDM / [place] / ED COURSE  As part of my medical decision making, I reviewed the following data within the [place] notes reviewed and incorporated, Old chart reviewed, Notes from prior ED visits, and [place] Controlled Substance Database       *DORRIEN GRUNDER was evaluated in Emergency Department on 03/01/2020 for the symptoms described in the history of present illness. He was evaluated in the context of the global COVID-19 pandemic, which necessitated consideration that the patient might be at risk for infection with the SARS-CoV-2 virus that causes COVID-19. Institutional protocols and algorithms that pertain to the evaluation of patients at risk for COVID-19 are in a state of rapid change based on information released by regulatory bodies including the CDC and federal and state organizations. These policies and algorithms were followed during the patient's care in the ED.  Some ED evaluations and interventions may be delayed as a result of limited staffing during the pandemic.*  Clinical Course as of  Mar 02 1627  Wed Mar 01, 2020  1442 48 yo    [CI]    Clinical Course User Index [CI] Duffy Bruce, MD    Medical Decision Making:  49 yo M here with left upper abdominal and flank pain with hematuria. No fever, chills. WBC normal, Hgb normal. Renal function at baseline. Mild AST elevation noted, does admit to regular EtOH use but clinically no signs of hepatitis, and lipase is normal. CT stone study neg for abnormality, and CXR is clear without signs of CHF or basilar PNA. Trop 20 - unchanged on repeat with constant sx for days, and known CHF - doubt ACS. He had normal coronaries on last cath. Doubt ischemia.  Suspect pt's symptoms are actually 2/2 mild UTI with early renal involvement. He has a h/o urinary frequency, nocturia and +pyuria with bacteria on UA. Will treat with ABX, analgesia. While I suspect he has a component of BPH contributing to this, he has no known history. Will refer to Urology given hematuria, likely BPH with UTI in otherwise healthy male. Return precautions given.  ____________________________________________  FINAL CLINICAL IMPRESSION(S) / ED DIAGNOSES  Final diagnoses:  Left upper quadrant abdominal pain  Acute cystitis with hematuria     MEDICATIONS GIVEN DURING THIS VISIT:  Medications  oxyCODONE-acetaminophen (PERCOCET/ROXICET) 5-325 MG per tablet 2 tablet (2 tablets Oral Given 03/01/20 1345)  ondansetron (ZOFRAN-ODT) disintegrating tablet 4 mg (4 mg Oral Given 03/01/20 1346)  cefdinir (OMNICEF) capsule 300 mg (300 mg Oral Given 03/01/20 1506)     ED Discharge Orders         Ordered    cefdinir (OMNICEF) 300 MG capsule  2 times daily     03/01/20 1554    HYDROcodone-acetaminophen (NORCO/VICODIN) 5-325 MG tablet  Every 6 hours PRN     03/01/20 1554    ondansetron (ZOFRAN ODT) 4 MG disintegrating tablet  Every 8 hours PRN     03/01/20 1554           Note:  This document was prepared using Dragon voice recognition software and may include  unintentional dictation errors.   Duffy Bruce, MD 03/01/20 [phone]

## 2020-03-01 NOTE — Discharge Instructions (Signed)
The blood in your urine is likely due to an infection.  Take the antibiotic as prescribed.  Try to stay hydrated, drinking as much liquid as your cardiologist is comfortable with.  Take the Norco for pain and Zofran for nausea.  If you can take ibuprofen, take 400 mg every 8 hours for moderate pain.  Follow-up with a urologist in the next several weeks.

## 2020-03-02 LAB — URINE CULTURE: Culture: NO GROWTH

## 2020-03-27 ENCOUNTER — Ambulatory Visit (INDEPENDENT_AMBULATORY_CARE_PROVIDER_SITE_OTHER): Payer: Medicare Other | Admitting: Physician Assistant

## 2020-03-27 ENCOUNTER — Encounter: Payer: Self-pay | Admitting: Physician Assistant

## 2020-03-27 ENCOUNTER — Other Ambulatory Visit: Payer: Self-pay

## 2020-03-27 VITALS — BP 134/89 | HR 88 | Temp 96.9°F | Wt 214.6 lb

## 2020-03-27 DIAGNOSIS — E6609 Other obesity due to excess calories: Secondary | ICD-10-CM

## 2020-03-27 DIAGNOSIS — I472 Ventricular tachycardia: Secondary | ICD-10-CM

## 2020-03-27 DIAGNOSIS — I420 Dilated cardiomyopathy: Secondary | ICD-10-CM | POA: Diagnosis not present

## 2020-03-27 DIAGNOSIS — E119 Type 2 diabetes mellitus without complications: Secondary | ICD-10-CM | POA: Diagnosis not present

## 2020-03-27 DIAGNOSIS — E11621 Type 2 diabetes mellitus with foot ulcer: Secondary | ICD-10-CM | POA: Insufficient documentation

## 2020-03-27 DIAGNOSIS — Z6831 Body mass index (BMI) 31.0-31.9, adult: Secondary | ICD-10-CM

## 2020-03-27 DIAGNOSIS — I4729 Other ventricular tachycardia: Secondary | ICD-10-CM

## 2020-03-27 DIAGNOSIS — L97521 Non-pressure chronic ulcer of other part of left foot limited to breakdown of skin: Secondary | ICD-10-CM

## 2020-03-27 LAB — POCT GLYCOSYLATED HEMOGLOBIN (HGB A1C)
Est. average glucose Bld gHb Est-mCnc: 114
Hemoglobin A1C: 5.6 % (ref 4.0–5.6)

## 2020-03-27 MED ORDER — METFORMIN HCL 500 MG PO TABS: 500.0000 mg | ORAL_TABLET | Freq: Every day | ORAL | 3 refills | Status: DC

## 2020-03-27 NOTE — Progress Notes (Signed)
Established patient visit   Patient: Cody [name]   DOB: [birth year]   49 y.o. Male  MRN: [id number] Visit Date: 03/27/2020  Today's healthcare provider: Mar Daring, PA-C   Chief Complaint  Patient presents with  . Diabetes  . Hypertension  . Hyperlipidemia   Subjective    HPI Diabetes Mellitus Type II, follow-up  Lab Results  Component Value Date   HGBA1C 5.6 03/27/2020   HGBA1C 6.5 (H) 09/27/2019   HGBA1C 5.9 (A) 06/14/2019   Last seen for diabetes 9 months ago.  Management since then includes  discontinue metformin due to A1C been lower then 5.7, but patient states he started back taking the Metformin due to fasting sugars was high 200's to 300's. He reports good compliance with treatment. He is not having side effects.   Home blood sugar records: fasting range: 100's-140's  Episodes of hypoglycemia? No    Current insulin regiment: None Most Recent Eye Exam:   --------------------------------------------------------------------------------------------------- Hypertension, follow-up  BP Readings from Last 3 Encounters:  03/27/20 134/89  03/01/20 124/84  09/27/19 117/81   Wt Readings from Last 3 Encounters:  03/27/20 214 lb 9.6 oz (97.3 kg)  03/01/20 215 lb (97.5 kg)  09/27/19 220 lb 3.2 oz (99.9 kg)     He was last seen for hypertension 9 months ago.  Management since that visit includes continue current medication. He reports good compliance with treatment. He is not having side effects.  He is exercising. He is adherent to low salt diet.   Outside blood pressures are 100's-130's/80's  He does not smoke.  Use of agents associated with hypertension: none.   --------------------------------------------------------------------------------------------------- Lipid/Cholesterol, follow-up  Last Lipid Panel: Lab Results  Component Value Date   CHOL 179 09/27/2019   LDLCALC 120 (H) 09/27/2019   HDL 35 (L) 09/27/2019   TRIG 131  09/27/2019    He was last seen for this 9 months ago.  Management since that visit includes continue atorvastatin 72m.  He reports good compliance with treatment. He is not having side effects.   Symptoms: No appetite changes No foot ulcerations  No chest pain No chest pressure/discomfort  No dyspnea No orthopnea  No fatigue No lower extremity edema  No palpitations No paroxysmal nocturnal dyspnea  No nausea No numbness or tingling of extremity  No polydipsia No polyuria  No speech difficulty No syncope   He is following a Regular diet. Current exercise: walking  Last metabolic panel Lab Results  Component Value Date   GLUCOSE 134 (H) 03/01/2020   NA 135 03/01/2020   K 3.5 03/01/2020   BUN 9 03/01/2020   CREATININE 1.02 03/01/2020   GFRNONAA >60 03/01/2020   GFRAA >60 03/01/2020   CALCIUM 7.9 (L) 03/01/2020   AST 46 (H) 03/01/2020   ALT 30 03/01/2020   The 10-year ASCVD risk score (Mikey BussingDC Jr., et al., 2013) is: 7%  ---------------------------------------------------------------------------------------------------   Patient Active Problem List   Diagnosis Date Noted  . Diabetic ulcer of toe of left foot associated with type 2 diabetes mellitus, limited to breakdown of skin ([place] 03/27/2020  . Pharyngitis 05/29/2018  . Chronic systolic heart failure ([place] 04/10/2017  . NSVT (nonsustained ventricular tachycardia) ([place] 04/10/2017  . AICD (automatic cardioverter/defibrillator) present 04/10/2017  . Well controlled diabetes mellitus ([place] 05/16/2016  . Gout 05/16/2016  . HLD (hyperlipidemia) 05/16/2016  . Benign hypertension 05/16/2016  . Cardiomyopathy ([place] 05/16/2016  . Adiposity 05/16/2016  . Cardiac pacemaker in situ 03/03/2012  .  Left bundle branch block 09/03/2011  . Nonischemic dilated cardiomyopathy ([place]) 09/03/2011   Past Medical History:  Diagnosis Date  . AICD (automatic cardioverter/defibrillator) present   . Cardiomyopathy, nonischemic ([place])     . Hyperlipemia   . Hypertension   . Presence of permanent cardiac pacemaker        Medications: Outpatient Medications Prior to Visit  Medication Sig  . allopurinol (ZYLOPRIM) 100 MG tablet Take 300 mg by mouth daily.   Marland Kitchen aspirin 81 MG tablet Take 81 mg by mouth daily.   Marland Kitchen atorvastatin (LIPITOR) 20 MG tablet Take 1 tablet (20 mg total) by mouth daily.  . blood glucose meter kit and supplies KIT Dispense based on patient and insurance preference. Use up to four times daily as directed. (FOR ICD-9 250.00, 250.01).  . carvedilol (COREG) 3.125 MG tablet Take 3.125 mg by mouth 2 (two) times daily with a meal.   . digoxin (LANOXIN) 0.25 MG tablet Take 0.25 mg by mouth daily.  Marland Kitchen ENTRESTO 24-26 MG Take 1 tablet by mouth 2 (two) times daily.   . furosemide (LASIX) 20 MG tablet Take 40 mg by mouth daily.   . ondansetron (ZOFRAN ODT) 4 MG disintegrating tablet Take 1 tablet (4 mg total) by mouth every 8 (eight) hours as needed for nausea or vomiting.  . potassium chloride SA (KLOR-CON) 20 MEQ tablet One tablet daily  . [DISCONTINUED] HYDROcodone-acetaminophen (NORCO/VICODIN) 5-325 MG tablet Take 1-2 tablets by mouth every 6 (six) hours as needed for moderate pain or severe pain.   No facility-administered medications prior to visit.    Review of Systems  Last CBC Lab Results  Component Value Date   WBC 9.3 03/01/2020   HGB 15.7 03/01/2020   HCT 45.4 03/01/2020   MCV 98.7 03/01/2020   MCH 34.1 (H) 03/01/2020   RDW 11.8 03/01/2020   PLT 157 94/76/5465   Last metabolic panel Lab Results  Component Value Date   GLUCOSE 134 (H) 03/01/2020   NA 135 03/01/2020   K 3.5 03/01/2020   CL 97 (L) 03/01/2020   CO2 27 03/01/2020   BUN 9 03/01/2020   CREATININE 1.02 03/01/2020   GFRNONAA >60 03/01/2020   GFRAA >60 03/01/2020   CALCIUM 7.9 (L) 03/01/2020   PROT 8.0 03/01/2020   ALBUMIN 3.5 03/01/2020   LABGLOB 3.9 09/27/2019   AGRATIO 1.1 (L) 09/27/2019   BILITOT 1.7 (H) 03/01/2020    ALKPHOS 35 (L) 03/01/2020   AST 46 (H) 03/01/2020   ALT 30 03/01/2020   ANIONGAP 11 03/01/2020   Last lipids Lab Results  Component Value Date   CHOL 179 09/27/2019   HDL 35 (L) 09/27/2019   LDLCALC 120 (H) 09/27/2019   TRIG 131 09/27/2019   CHOLHDL 5.1 (H) 09/27/2019   Last hemoglobin A1c Lab Results  Component Value Date   HGBA1C 5.6 03/27/2020      Objective    BP 134/89 (BP Location: Left Arm, Patient Position: Sitting, Cuff Size: Normal)   Pulse 88   Temp (!) 96.9 F (36.1 C) (Temporal)   Wt 214 lb 9.6 oz (97.3 kg)   BMI 31.69 kg/m  BP Readings from Last 3 Encounters:  03/27/20 134/89  03/01/20 124/84  09/27/19 117/81   Wt Readings from Last 3 Encounters:  03/27/20 214 lb 9.6 oz (97.3 kg)  03/01/20 215 lb (97.5 kg)  09/27/19 220 lb 3.2 oz (99.9 kg)      Physical Exam Vitals reviewed.  Constitutional:  General: He is not in acute distress.    Appearance: Normal appearance. He is well-developed. He is obese. He is not ill-appearing or diaphoretic.  HENT:     Head: Normocephalic and atraumatic.  Cardiovascular:     Rate and Rhythm: Normal rate and regular rhythm.     Pulses: Normal pulses.          Dorsalis pedis pulses are 2+ on the right side and 2+ on the left side.       Posterior tibial pulses are 2+ on the right side and 2+ on the left side.     Heart sounds: Normal heart sounds. No murmur. No friction rub. No gallop.   Pulmonary:     Effort: Pulmonary effort is normal. No respiratory distress.     Breath sounds: Normal breath sounds. No wheezing or rales.  Musculoskeletal:     Cervical back: Normal range of motion and neck supple.     Right lower leg: No edema.     Left lower leg: No edema.     Right foot: Normal range of motion. No bunion or prominent metatarsal heads.     Left foot: Normal range of motion. No bunion or prominent metatarsal heads.       Feet:  Feet:     Right foot:     Skin integrity: No skin breakdown, erythema or  callus.     Toenail Condition: Right toenails are normal.     Left foot:     Skin integrity: Skin breakdown, erythema and callus present.     Toenail Condition: Left toenails are normal.  Skin:    Capillary Refill: Capillary refill takes less than 2 seconds.  Neurological:     General: No focal deficit present.     Mental Status: He is alert. Mental status is at baseline.  Psychiatric:        Mood and Affect: Mood normal.        Behavior: Behavior normal.        Thought Content: Thought content normal.        Judgment: Judgment normal.       Results for orders placed or performed in visit on 03/27/20  POCT glycosylated hemoglobin (Hb A1C)  Result Value Ref Range   Hemoglobin A1C 5.6 4.0 - 5.6 %   HbA1c POC (<> result, manual entry)     HbA1c, POC (prediabetic range)     HbA1c, POC (controlled diabetic range)     Est. average glucose Bld gHb Est-mCnc 114     Assessment & Plan     1. Well controlled diabetes mellitus ([place]) A1c today at 5.6. Improved from 6.5. Continue Metformin but decrease to 543m daily from 1001mdaily. Return in 6 months for CPE. - metFORMIN (GLUCOPHAGE) 500 MG tablet; Take 1 tablet (500 mg total) by mouth daily with breakfast.  Dispense: 90 tablet; Refill: 3 - Ambulatory referral to Podiatry  2. Class 1 obesity due to excess calories with serious comorbidity and body mass index (BMI) of 31.0 to 31.9 in adult Counseled patient on healthy lifestyle modifications including dieting and exercise.   3. Nonischemic dilated cardiomyopathy (HCC) Stable. Followed by Cardiology.   4. NSVT (nonsustained ventricular tachycardia) (HCC) Stable. Followed by Cardiology. Defibrillator in situ.   5. Diabetic ulcer of toe of left foot associated with type 2 diabetes mellitus, limited to breakdown of skin (HCC) New sore on right great toe. Reports it had been a thick callus that would split and bleed  up until he was placed on an antibiotic for a UTI in April 2021. At  that time he reports the skin sloughed off in a large chunk and left behind some small areas of callus, central darkened area. Had been tender, but now no pain and more numb. No other areas of numbness or sores on the feet. Referral placed to podiatry as below for further evaluation and treatment considerations.  - Ambulatory referral to Podiatry  No follow-ups on file.         Rubye Beach  [facility] [phone] (phone) [phone] (fax)  [place]

## 2020-04-12 ENCOUNTER — Ambulatory Visit: Payer: Medicare Other | Admitting: Podiatry

## 2020-04-19 DIAGNOSIS — G4733 Obstructive sleep apnea (adult) (pediatric): Secondary | ICD-10-CM | POA: Diagnosis not present

## 2020-04-19 DIAGNOSIS — I5022 Chronic systolic (congestive) heart failure: Secondary | ICD-10-CM | POA: Diagnosis not present

## 2020-04-19 DIAGNOSIS — E782 Mixed hyperlipidemia: Secondary | ICD-10-CM | POA: Diagnosis not present

## 2020-04-19 DIAGNOSIS — Z95810 Presence of automatic (implantable) cardiac defibrillator: Secondary | ICD-10-CM | POA: Diagnosis not present

## 2020-04-19 DIAGNOSIS — I447 Left bundle-branch block, unspecified: Secondary | ICD-10-CM | POA: Diagnosis not present

## 2020-04-19 DIAGNOSIS — Z6832 Body mass index (BMI) 32.0-32.9, adult: Secondary | ICD-10-CM | POA: Diagnosis not present

## 2020-04-19 DIAGNOSIS — E669 Obesity, unspecified: Secondary | ICD-10-CM | POA: Diagnosis not present

## 2020-04-19 DIAGNOSIS — I10 Essential (primary) hypertension: Secondary | ICD-10-CM | POA: Diagnosis not present

## 2020-04-19 DIAGNOSIS — E119 Type 2 diabetes mellitus without complications: Secondary | ICD-10-CM | POA: Diagnosis not present

## 2020-04-19 DIAGNOSIS — I428 Other cardiomyopathies: Secondary | ICD-10-CM | POA: Diagnosis not present

## 2020-04-22 ENCOUNTER — Ambulatory Visit (INDEPENDENT_AMBULATORY_CARE_PROVIDER_SITE_OTHER): Payer: Medicare HMO | Admitting: Podiatry

## 2020-04-22 ENCOUNTER — Other Ambulatory Visit: Payer: Self-pay

## 2020-04-22 ENCOUNTER — Ambulatory Visit (INDEPENDENT_AMBULATORY_CARE_PROVIDER_SITE_OTHER): Payer: Medicare HMO

## 2020-04-22 ENCOUNTER — Encounter: Payer: Self-pay | Admitting: Podiatry

## 2020-04-22 VITALS — Temp 97.3°F

## 2020-04-22 DIAGNOSIS — E1149 Type 2 diabetes mellitus with other diabetic neurological complication: Secondary | ICD-10-CM | POA: Diagnosis not present

## 2020-04-22 DIAGNOSIS — L97529 Non-pressure chronic ulcer of other part of left foot with unspecified severity: Secondary | ICD-10-CM

## 2020-04-22 MED ORDER — SILVER SULFADIAZINE 1 % EX CREA: 1.0000 "application " | TOPICAL_CREAM | Freq: Every day | CUTANEOUS | 0 refills | Status: DC

## 2020-04-22 MED ORDER — DOXYCYCLINE HYCLATE 100 MG PO TABS: 100.0000 mg | ORAL_TABLET | Freq: Two times a day (BID) | ORAL | 0 refills | Status: DC

## 2020-04-22 NOTE — Progress Notes (Signed)
Subjective:   Patient ID: Cody [name], male   DOB: 49 y.o.   MRN: [id number]   HPI 49 year old male presents the office today for concerns of a wound on the bottom of his left big toe.  He states this started off with a blister and progressed into a wound.  Has been keeping moisturizer on the area daily.  He has not had any purulence but only bloody drainage.  Denies any swelling or redness or any red streaks.  He is diabetic and last A1c was 5.6.  He has numbness to both of his big toes but denies any claudication symptoms.  No other history of ulcerations.  No other concerns.   Review of Systems  All other systems reviewed and are negative.  Past Medical History:  Diagnosis Date   AICD (automatic cardioverter/defibrillator) present    Cardiomyopathy, nonischemic ([place])    Hyperlipemia    Hypertension    Presence of permanent cardiac pacemaker     Past Surgical History:  Procedure Laterality Date   CARDIAC SURGERY     pacemaker placed in 2010, in 2014 batteries were replased   INSERT / REPLACE / REMOVE PACEMAKER       Current Outpatient Medications:    allopurinol (ZYLOPRIM) 100 MG tablet, Take 300 mg by mouth daily. , Disp: , Rfl:    aspirin 81 MG tablet, Take 81 mg by mouth daily. , Disp: , Rfl:    atorvastatin (LIPITOR) 20 MG tablet, Take 1 tablet (20 mg total) by mouth daily., Disp: 90 tablet, Rfl: 3   blood glucose meter kit and supplies KIT, Dispense based on patient and insurance preference. Use up to four times daily as directed. (FOR ICD-9 250.00, 250.01)., Disp: 1 each, Rfl: 0   carvedilol (COREG) 3.125 MG tablet, Take 3.125 mg by mouth 2 (two) times daily with a meal. , Disp: , Rfl:    digoxin (LANOXIN) 0.25 MG tablet, Take 0.25 mg by mouth daily., Disp: , Rfl:    doxycycline (VIBRA-TABS) 100 MG tablet, Take 1 tablet (100 mg total) by mouth 2 (two) times daily., Disp: 20 tablet, Rfl: 0   ENTRESTO 24-26 MG, Take 1 tablet by mouth 2 (two) times daily. ,  Disp: , Rfl: 0   furosemide (LASIX) 20 MG tablet, Take 40 mg by mouth daily. , Disp: , Rfl:    metFORMIN (GLUCOPHAGE) 1000 MG tablet, Take 1,000 mg by mouth daily., Disp: , Rfl:    metFORMIN (GLUCOPHAGE) 500 MG tablet, Take 1 tablet (500 mg total) by mouth daily with breakfast., Disp: 90 tablet, Rfl: 3   ondansetron (ZOFRAN ODT) 4 MG disintegrating tablet, Take 1 tablet (4 mg total) by mouth every 8 (eight) hours as needed for nausea or vomiting., Disp: 20 tablet, Rfl: 0   potassium chloride SA (KLOR-CON) 20 MEQ tablet, One tablet daily, Disp: 90 tablet, Rfl: 1   silver sulfADIAZINE (SILVADENE) 1 % cream, Apply 1 application topically daily., Disp: 50 g, Rfl: 0  No Known Allergies      Objective:  Physical Exam  General: AAO x3, NAD  Dermatological: On the plantar aspect of the left foot is hyperkeratotic tissue with central wound.  After debridement of the tissue measured 1 x 0.2 x 0.1 cm.  There is no surrounding erythema, ascending cellulitis there is no fluctuation capitation.  There is no malodor.  Vascular: Dorsalis Pedis artery and Posterior Tibial artery pedal pulses are 2/4 bilateral with immedate capillary fill time. There is no pain with  calf compression, swelling, warmth, erythema.   Neruologic: Sensation decreased with Semmes Weinstein monofilament to bilateral hallux.  Musculoskeletal: No gross boney pedal deformities bilateral. No pain, crepitus, or limitation noted with foot and ankle range of motion bilateral. Muscular strength 5/5 in all groups tested bilateral.  Gait: Unassisted, Nonantalgic.       Assessment:   Left hallux ulceration    Plan:  -Treatment options discussed including all alternatives, risks, and complications -Etiology of symptoms were discussed -X-rays were obtained and reviewed with the patient.  Accessory sesamoid present plantar hallux IPJ however there is no evidence of acute fracture, osteomyelitis there is no soft tissue  emphysema. -Debrided the hyperkeratotic tissue, wound utilizing the 312 with scalpel down to healthy, granular tissue. -Silvadene to the wound daily. -Doxycycline -Dispensed cam boot for offloading.  Encouraged elevation staying off the foot as much as possible. -Monitor for any clinical signs or symptoms of infection and directed to call the office immediately should any occur or go to the ER.  Trula Slade DPM

## 2020-05-02 DIAGNOSIS — I5022 Chronic systolic (congestive) heart failure: Secondary | ICD-10-CM | POA: Diagnosis not present

## 2020-05-05 ENCOUNTER — Ambulatory Visit: Payer: Medicare Other | Admitting: Podiatry

## 2020-05-09 ENCOUNTER — Ambulatory Visit: Payer: Medicare HMO | Admitting: Podiatry

## 2020-06-29 ENCOUNTER — Other Ambulatory Visit: Payer: Self-pay | Admitting: Physician Assistant

## 2020-07-10 DIAGNOSIS — D229 Melanocytic nevi, unspecified: Secondary | ICD-10-CM | POA: Diagnosis not present

## 2020-07-10 DIAGNOSIS — L821 Other seborrheic keratosis: Secondary | ICD-10-CM | POA: Diagnosis not present

## 2020-07-10 DIAGNOSIS — L918 Other hypertrophic disorders of the skin: Secondary | ICD-10-CM | POA: Diagnosis not present

## 2020-07-10 DIAGNOSIS — Z86018 Personal history of other benign neoplasm: Secondary | ICD-10-CM | POA: Diagnosis not present

## 2020-07-10 DIAGNOSIS — L578 Other skin changes due to chronic exposure to nonionizing radiation: Secondary | ICD-10-CM | POA: Diagnosis not present

## 2020-09-05 DIAGNOSIS — I5022 Chronic systolic (congestive) heart failure: Secondary | ICD-10-CM | POA: Diagnosis not present

## 2020-10-02 ENCOUNTER — Encounter: Payer: Medicare Other | Admitting: Physician Assistant

## 2020-10-23 DIAGNOSIS — I428 Other cardiomyopathies: Secondary | ICD-10-CM | POA: Diagnosis not present

## 2020-10-23 DIAGNOSIS — G4733 Obstructive sleep apnea (adult) (pediatric): Secondary | ICD-10-CM | POA: Diagnosis not present

## 2020-10-23 DIAGNOSIS — E669 Obesity, unspecified: Secondary | ICD-10-CM | POA: Diagnosis not present

## 2020-10-23 DIAGNOSIS — I5022 Chronic systolic (congestive) heart failure: Secondary | ICD-10-CM | POA: Diagnosis not present

## 2020-10-23 DIAGNOSIS — I10 Essential (primary) hypertension: Secondary | ICD-10-CM | POA: Diagnosis not present

## 2020-10-23 DIAGNOSIS — E119 Type 2 diabetes mellitus without complications: Secondary | ICD-10-CM | POA: Diagnosis not present

## 2020-10-23 DIAGNOSIS — E782 Mixed hyperlipidemia: Secondary | ICD-10-CM | POA: Diagnosis not present

## 2020-10-23 DIAGNOSIS — Z95810 Presence of automatic (implantable) cardiac defibrillator: Secondary | ICD-10-CM | POA: Diagnosis not present

## 2020-10-23 DIAGNOSIS — I447 Left bundle-branch block, unspecified: Secondary | ICD-10-CM | POA: Diagnosis not present

## 2020-10-23 DIAGNOSIS — Z23 Encounter for immunization: Secondary | ICD-10-CM | POA: Diagnosis not present

## 2020-10-30 ENCOUNTER — Other Ambulatory Visit: Payer: Self-pay | Admitting: Physician Assistant

## 2020-10-30 NOTE — Telephone Encounter (Signed)
Requested Prescriptions  Pending Prescriptions Disp Refills  . potassium chloride SA (KLOR-CON) 20 MEQ tablet [Pharmacy Med Name: Potassium Chloride Crys ER 20 MEQ Oral Tablet Extended Release] 90 tablet 0    Sig: Take 1 tablet by mouth once daily     Endocrinology:  Minerals - Potassium Supplementation Passed - 10/30/2020  9:26 AM      Passed - K in normal range and within 360 days    Potassium  Date Value Ref Range Status  03/01/2020 3.5 3.5 - 5.1 mmol/L Final         Passed - Cr in normal range and within 360 days    Creatinine, Ser  Date Value Ref Range Status  03/01/2020 1.02 0.61 - 1.24 mg/dL Final         Passed - Valid encounter within last 12 months    Recent Outpatient Visits          7 months ago Well controlled diabetes mellitus [facility])   [place], [place], PA-C   1 year ago Diabetes mellitus without complication [facility])   [place], [place], PA-C   1 year ago Diabetes mellitus without complication [facility])   [place], [place]   2 years ago Diabetes mellitus without complication [facility])   [place], [place]   2 years ago Diabetes mellitus without complication [facility])   [place], [place], [place]

## 2020-11-29 ENCOUNTER — Telehealth: Payer: Self-pay | Admitting: *Deleted

## 2020-11-29 NOTE — Chronic Care Management (AMB) (Signed)
  Chronic Care Management   Outreach Note  11/29/2020 Name: Cody [name] MRN: [id number] DOB: [birth year]  Cody [name] is a 50 y.o. year old male who is a primary care patient of [facility]. I reached out to Cody [name] by phone today in response to a referral sent by Cody [name]'s health plan.     An unsuccessful telephone outreach was attempted today. The patient was referred to the case management team for assistance with care management and care coordination.   Follow Up Plan: A HIPAA compliant phone message was left for the patient providing contact information and requesting a return call. The care management team will reach out to the patient again over the next 7 days. If patient returns call to provider office, please advise to call [place] at [phone].  [place] Management

## 2020-12-05 NOTE — Chronic Care Management (AMB) (Signed)
  Chronic Care Management   Outreach Note  12/05/2020 Name: Cody [name] MRN: [id number] DOB: 11-Oct-1971  Cody [name] is a 50 y.o. year old male who is a primary care patient of [facility]ye [facility]. I reached out to Bo Merino by phone today in response to a referral sent by Mr. Cody [name]'s health plan.     A second unsuccessful telephone outreach was attempted today. The patient was referred to the case management team for assistance with care management and care coordination.   Follow Up Plan: A HIPAA compliant phone message was left for the patient providing contact information and requesting a return call. The care management team will reach out to the patient again over the next 7 days. If patient returns call to provider office, please advise to call [place] at [phone].  [place] Management

## 2020-12-11 NOTE — Chronic Care Management (AMB) (Signed)
  Chronic Care Management   Outreach Note  12/11/2020 Name: TAYARI YANKEE MRN: [id number] DOB: 01-24-1971  HUMBERT MOROZOV is a 50 y.o. year old male who is a primary care patient of [facility]. I reached out to Bo Merino by phone today in response to a referral sent by Mr. Hanif Radin Wender's health plan.     Third unsuccessful telephone outreach was attempted today. The patient was referred to the case management team for assistance with care management and care coordination. The patient's primary care provider has been notified of our unsuccessful attempts to make or maintain contact with the patient. The care management team is pleased to engage with this patient at any time in the future should he/she be interested in assistance from the care management team.   Follow Up Plan: A HIPAA compliant phone message was left for the patient providing contact information and requesting a return call. If patient returns call to provider office, please advise to call [place] at [phone].  [place] Management

## 2020-12-13 ENCOUNTER — Telehealth: Payer: Self-pay | Admitting: Physician Assistant

## 2020-12-13 NOTE — Telephone Encounter (Signed)
Copied from [place] [id number]. Topic: Medicare AWV >> Dec 13, 2020 12:15 PM Cher Nakai R wrote: Reason for CRM:   No answer unable to leave a message for patient to call back and schedule Medicare Annual Wellness Visit (AWV) in office.   If not able to come in office, please offer to do virtually or by telephone.   Last AWV 09/27/2019  Please schedule at anytime with [facility] Health Advisor.  If any questions, please contact me at [phone]

## 2021-01-30 DIAGNOSIS — I5022 Chronic systolic (congestive) heart failure: Secondary | ICD-10-CM | POA: Diagnosis not present

## 2021-03-24 ENCOUNTER — Other Ambulatory Visit: Payer: Self-pay | Admitting: Physician Assistant

## 2021-03-26 NOTE — Telephone Encounter (Signed)
Requested medication (s) are due for refill today:no  Requested medication (s) are on the active medication list: yes  Last refill: 10/30/2020  Future visit scheduled:no  Notes to clinic: overdue for labs and follow up    Requested Prescriptions  Pending Prescriptions Disp Refills   potassium chloride SA (KLOR-CON) 20 MEQ tablet [Pharmacy Med Name: Potassium Chloride Crys ER 20 MEQ Oral Tablet Extended Release] 90 tablet 0    Sig: TAKE 1  BY MOUTH ONCE DAILY      Endocrinology:  Minerals - Potassium Supplementation Failed - 03/24/2021  3:41 PM      Failed - K in normal range and within 360 days    Potassium  Date Value Ref Range Status  03/01/2020 3.5 3.5 - 5.1 mmol/L Final          Failed - Cr in normal range and within 360 days    Creatinine, Ser  Date Value Ref Range Status  03/01/2020 1.02 0.61 - 1.24 mg/dL Final          Failed - Valid encounter within last 12 months    Recent Outpatient Visits           12 months ago Well controlled diabetes mellitus [facility])   [place], PA-C   1 year ago Diabetes mellitus without complication [facility])   [place], [place], [place]   2 years ago Diabetes mellitus without complication [facility])   [place], [place]   2 years ago Diabetes mellitus without complication [facility])   [place], [place]   2 years ago Diabetes mellitus without complication [facility])   [place], [place]

## 2021-03-28 ENCOUNTER — Other Ambulatory Visit: Payer: Self-pay | Admitting: Physician Assistant

## 2021-04-25 DIAGNOSIS — E119 Type 2 diabetes mellitus without complications: Secondary | ICD-10-CM | POA: Diagnosis not present

## 2021-04-25 DIAGNOSIS — Z0100 Encounter for examination of eyes and vision without abnormal findings: Secondary | ICD-10-CM | POA: Diagnosis not present

## 2021-06-13 DIAGNOSIS — Z0000 Encounter for general adult medical examination without abnormal findings: Secondary | ICD-10-CM | POA: Diagnosis not present

## 2021-06-13 DIAGNOSIS — I10 Essential (primary) hypertension: Secondary | ICD-10-CM | POA: Diagnosis not present

## 2021-06-13 DIAGNOSIS — E782 Mixed hyperlipidemia: Secondary | ICD-10-CM | POA: Diagnosis not present

## 2021-06-13 DIAGNOSIS — Z5181 Encounter for therapeutic drug level monitoring: Secondary | ICD-10-CM | POA: Diagnosis not present

## 2021-06-13 DIAGNOSIS — G4733 Obstructive sleep apnea (adult) (pediatric): Secondary | ICD-10-CM | POA: Diagnosis not present

## 2021-06-13 DIAGNOSIS — E119 Type 2 diabetes mellitus without complications: Secondary | ICD-10-CM | POA: Diagnosis not present

## 2021-06-13 DIAGNOSIS — I447 Left bundle-branch block, unspecified: Secondary | ICD-10-CM | POA: Diagnosis not present

## 2021-06-13 DIAGNOSIS — Z95810 Presence of automatic (implantable) cardiac defibrillator: Secondary | ICD-10-CM | POA: Diagnosis not present

## 2021-06-13 DIAGNOSIS — I5022 Chronic systolic (congestive) heart failure: Secondary | ICD-10-CM | POA: Diagnosis not present

## 2021-06-13 DIAGNOSIS — I420 Dilated cardiomyopathy: Secondary | ICD-10-CM | POA: Diagnosis not present

## 2021-06-13 DIAGNOSIS — Z79899 Other long term (current) drug therapy: Secondary | ICD-10-CM | POA: Diagnosis not present

## 2021-07-03 DIAGNOSIS — I5022 Chronic systolic (congestive) heart failure: Secondary | ICD-10-CM | POA: Diagnosis not present

## 2021-09-03 ENCOUNTER — Ambulatory Visit: Payer: Medicare HMO | Admitting: Family Medicine

## 2021-09-03 NOTE — Progress Notes (Deleted)
Established patient visit   Patient: Cody [name]   DOB: June 09, 1971   50 y.o. Male  MRN: [id number] Visit Date: 09/03/2021  Today's healthcare provider: Wilhemena Durie, MD   No chief complaint on file.  Subjective    HPI  Diabetes Mellitus Type II, follow-up  Lab Results  Component Value Date   HGBA1C 5.6 03/27/2020   HGBA1C 6.5 (H) 09/27/2019   HGBA1C 5.9 (A) 06/14/2019   Last seen for diabetes 5 months ago.  Management since then includes; A1c today at 5.6. Improved from 6.5. Continue Metformin but decrease to 598m daily from 1002mdaily. He reports {excellent/good/fair/poor:19665} compliance with treatment. He {is/is not:21021397} having side effects. {document side effects if present:1}  Home blood sugar records: {diabetes glucometry results:16657}  Episodes of hypoglycemia? {Yes/No:20286} {enter details if yes:1}   Current insulin regiment: {***Type 'None' if not taking insulin                                                otherwise enter complete                                                 details of insulin regiment:1} Most Recent Eye Exam: ***  --------------------------------------------------------------------------------------------------- Lipid/Cholesterol, follow-up  Last Lipid Panel: Lab Results  Component Value Date   CHOL 179 09/27/2019   LDLCALC 120 (H) 09/27/2019   HDL 35 (L) 09/27/2019   TRIG 131 09/27/2019    He was last seen for this 09/27/2019.  Management since that visit includes; on atorvastatin.  He reports {excellent/good/fair/poor:19665} compliance with treatment. He {is/is not:9024} having side effects. {document side effects if present:1}  He is following a {diet:21022986} diet. Current exercise: {exercise tyYJEHU:31497}Last metabolic panel Lab Results  Component Value Date   GLUCOSE 134 (H) 03/01/2020   NA 135 03/01/2020   K 3.5 03/01/2020   BUN 9 03/01/2020   CREATININE 1.02 03/01/2020   GFRNONAA >60  03/01/2020   CALCIUM 7.9 (L) 03/01/2020   AST 46 (H) 03/01/2020   ALT 30 03/01/2020   The 10-year ASCVD risk score (Arnett DK, et al., 2019) is: 7.2%  ---------------------------------------------------------------------------------------------------   {Link to patient history deactivated due to formatting error:1}  Medications: Outpatient Medications Prior to Visit  Medication Sig   allopurinol (ZYLOPRIM) 100 MG tablet Take 300 mg by mouth daily.    aspirin 81 MG tablet Take 81 mg by mouth daily.    atorvastatin (LIPITOR) 20 MG tablet Take 1 tablet (20 mg total) by mouth daily.   blood glucose meter kit and supplies KIT Dispense based on patient and insurance preference. Use up to four times daily as directed. (FOR ICD-9 250.00, 250.01).   carvedilol (COREG) 3.125 MG tablet Take 3.125 mg by mouth 2 (two) times daily with a meal.    digoxin (LANOXIN) 0.25 MG tablet Take 0.25 mg by mouth daily.   doxycycline (VIBRA-TABS) 100 MG tablet Take 1 tablet (100 mg total) by mouth 2 (two) times daily.   ENTRESTO 24-26 MG Take 1 tablet by mouth 2 (two) times daily.    furosemide (LASIX) 20 MG tablet Take 40 mg by mouth daily.    metFORMIN (GLUCOPHAGE) 1000 MG tablet  Take 1,000 mg by mouth daily.   metFORMIN (GLUCOPHAGE) 500 MG tablet Take 1 tablet (500 mg total) by mouth daily with breakfast.   ondansetron (ZOFRAN ODT) 4 MG disintegrating tablet Take 1 tablet (4 mg total) by mouth every 8 (eight) hours as needed for nausea or vomiting.   potassium chloride SA (KLOR-CON) 20 MEQ tablet Take 1 tablet (20 mEq total) by mouth daily. Please schedule an office visit before anymore refills.   silver sulfADIAZINE (SILVADENE) 1 % cream Apply 1 application topically daily.   No facility-administered medications prior to visit.    Review of Systems  {Labs  Heme  Chem  Endocrine  Serology  Results Review (optional):23779}   Objective    There were no vitals taken for this visit. {Show previous  vital signs (optional):23777}  Physical Exam  ***  No results found for any visits on 09/03/21.  Assessment & Plan     ***  No follow-ups on file.      {provider attestation***:1}   Wilhemena Durie, MD  [facility] [phone] (phone) [phone] (fax)  [place]

## 2021-11-14 DIAGNOSIS — I428 Other cardiomyopathies: Secondary | ICD-10-CM | POA: Diagnosis not present

## 2021-11-14 DIAGNOSIS — I447 Left bundle-branch block, unspecified: Secondary | ICD-10-CM | POA: Diagnosis not present

## 2021-11-14 DIAGNOSIS — E7800 Pure hypercholesterolemia, unspecified: Secondary | ICD-10-CM | POA: Diagnosis not present

## 2021-11-14 DIAGNOSIS — Z950 Presence of cardiac pacemaker: Secondary | ICD-10-CM | POA: Diagnosis not present

## 2021-11-14 DIAGNOSIS — I10 Essential (primary) hypertension: Secondary | ICD-10-CM | POA: Diagnosis not present

## 2021-11-14 DIAGNOSIS — R42 Dizziness and giddiness: Secondary | ICD-10-CM | POA: Diagnosis not present

## 2021-11-14 DIAGNOSIS — E119 Type 2 diabetes mellitus without complications: Secondary | ICD-10-CM | POA: Diagnosis not present

## 2021-11-14 DIAGNOSIS — E669 Obesity, unspecified: Secondary | ICD-10-CM | POA: Diagnosis not present

## 2021-11-14 DIAGNOSIS — G4733 Obstructive sleep apnea (adult) (pediatric): Secondary | ICD-10-CM | POA: Diagnosis not present

## 2021-11-14 DIAGNOSIS — I5032 Chronic diastolic (congestive) heart failure: Secondary | ICD-10-CM | POA: Diagnosis not present

## 2022-04-10 DIAGNOSIS — G4733 Obstructive sleep apnea (adult) (pediatric): Secondary | ICD-10-CM | POA: Diagnosis not present

## 2022-04-10 DIAGNOSIS — I5032 Chronic diastolic (congestive) heart failure: Secondary | ICD-10-CM | POA: Diagnosis not present

## 2022-04-10 DIAGNOSIS — E669 Obesity, unspecified: Secondary | ICD-10-CM | POA: Diagnosis not present

## 2022-04-10 DIAGNOSIS — Z8639 Personal history of other endocrine, nutritional and metabolic disease: Secondary | ICD-10-CM | POA: Diagnosis not present

## 2022-04-10 DIAGNOSIS — Z950 Presence of cardiac pacemaker: Secondary | ICD-10-CM | POA: Diagnosis not present

## 2022-04-10 DIAGNOSIS — E7800 Pure hypercholesterolemia, unspecified: Secondary | ICD-10-CM | POA: Diagnosis not present

## 2022-04-10 DIAGNOSIS — I951 Orthostatic hypotension: Secondary | ICD-10-CM | POA: Diagnosis not present

## 2022-04-10 DIAGNOSIS — I10 Essential (primary) hypertension: Secondary | ICD-10-CM | POA: Diagnosis not present

## 2022-04-10 DIAGNOSIS — I447 Left bundle-branch block, unspecified: Secondary | ICD-10-CM | POA: Diagnosis not present

## 2022-04-10 DIAGNOSIS — I428 Other cardiomyopathies: Secondary | ICD-10-CM | POA: Diagnosis not present

## 2022-05-01 DIAGNOSIS — I5032 Chronic diastolic (congestive) heart failure: Secondary | ICD-10-CM | POA: Diagnosis not present

## 2022-05-17 ENCOUNTER — Encounter: Payer: Self-pay | Admitting: Intensive Care

## 2022-05-17 ENCOUNTER — Encounter
Admission: EM | Disposition: A | Discharge status: Home / Self Care | Payer: Self-pay | Source: Home / Self Care | Attending: Internal Medicine

## 2022-05-17 ENCOUNTER — Observation Stay: Payer: Medicare HMO | Admitting: Anesthesiology

## 2022-05-17 ENCOUNTER — Inpatient Hospital Stay: Payer: Medicare HMO

## 2022-05-17 ENCOUNTER — Other Ambulatory Visit: Payer: Self-pay

## 2022-05-17 ENCOUNTER — Inpatient Hospital Stay
Admission: EM | Admit: 2022-05-17 | Discharge: 2022-05-20 | DRG: 377 | Disposition: A | Discharge status: Home / Self Care | Payer: Medicare HMO | Attending: Internal Medicine | Admitting: Internal Medicine

## 2022-05-17 DIAGNOSIS — E119 Type 2 diabetes mellitus without complications: Secondary | ICD-10-CM | POA: Diagnosis present

## 2022-05-17 DIAGNOSIS — E785 Hyperlipidemia, unspecified: Secondary | ICD-10-CM | POA: Diagnosis present

## 2022-05-17 DIAGNOSIS — K922 Gastrointestinal hemorrhage, unspecified: Secondary | ICD-10-CM | POA: Diagnosis not present

## 2022-05-17 DIAGNOSIS — E872 Acidosis, unspecified: Secondary | ICD-10-CM | POA: Diagnosis present

## 2022-05-17 DIAGNOSIS — Z7984 Long term (current) use of oral hypoglycemic drugs: Secondary | ICD-10-CM

## 2022-05-17 DIAGNOSIS — I8500 Esophageal varices without bleeding: Secondary | ICD-10-CM | POA: Diagnosis not present

## 2022-05-17 DIAGNOSIS — I420 Dilated cardiomyopathy: Secondary | ICD-10-CM | POA: Diagnosis present

## 2022-05-17 DIAGNOSIS — I959 Hypotension, unspecified: Secondary | ICD-10-CM | POA: Diagnosis not present

## 2022-05-17 DIAGNOSIS — N179 Acute kidney failure, unspecified: Secondary | ICD-10-CM | POA: Diagnosis not present

## 2022-05-17 DIAGNOSIS — I10 Essential (primary) hypertension: Secondary | ICD-10-CM | POA: Diagnosis not present

## 2022-05-17 DIAGNOSIS — K253 Acute gastric ulcer without hemorrhage or perforation: Secondary | ICD-10-CM | POA: Diagnosis not present

## 2022-05-17 DIAGNOSIS — Z79899 Other long term (current) drug therapy: Secondary | ICD-10-CM

## 2022-05-17 DIAGNOSIS — K31811 Angiodysplasia of stomach and duodenum with bleeding: Secondary | ICD-10-CM | POA: Diagnosis not present

## 2022-05-17 DIAGNOSIS — K259 Gastric ulcer, unspecified as acute or chronic, without hemorrhage or perforation: Secondary | ICD-10-CM | POA: Diagnosis not present

## 2022-05-17 DIAGNOSIS — K7200 Acute and subacute hepatic failure without coma: Secondary | ICD-10-CM | POA: Diagnosis present

## 2022-05-17 DIAGNOSIS — D696 Thrombocytopenia, unspecified: Secondary | ICD-10-CM | POA: Diagnosis present

## 2022-05-17 DIAGNOSIS — R1110 Vomiting, unspecified: Secondary | ICD-10-CM | POA: Diagnosis not present

## 2022-05-17 DIAGNOSIS — E876 Hypokalemia: Secondary | ICD-10-CM | POA: Diagnosis not present

## 2022-05-17 DIAGNOSIS — I447 Left bundle-branch block, unspecified: Secondary | ICD-10-CM | POA: Diagnosis present

## 2022-05-17 DIAGNOSIS — E871 Hypo-osmolality and hyponatremia: Secondary | ICD-10-CM | POA: Diagnosis present

## 2022-05-17 DIAGNOSIS — D649 Anemia, unspecified: Secondary | ICD-10-CM | POA: Diagnosis not present

## 2022-05-17 DIAGNOSIS — E669 Obesity, unspecified: Secondary | ICD-10-CM | POA: Diagnosis not present

## 2022-05-17 DIAGNOSIS — K920 Hematemesis: Secondary | ICD-10-CM | POA: Diagnosis not present

## 2022-05-17 DIAGNOSIS — I8501 Esophageal varices with bleeding: Secondary | ICD-10-CM | POA: Diagnosis not present

## 2022-05-17 DIAGNOSIS — K254 Chronic or unspecified gastric ulcer with hemorrhage: Secondary | ICD-10-CM | POA: Diagnosis not present

## 2022-05-17 DIAGNOSIS — Z95810 Presence of automatic (implantable) cardiac defibrillator: Secondary | ICD-10-CM

## 2022-05-17 DIAGNOSIS — I5032 Chronic diastolic (congestive) heart failure: Secondary | ICD-10-CM | POA: Diagnosis not present

## 2022-05-17 DIAGNOSIS — K760 Fatty (change of) liver, not elsewhere classified: Secondary | ICD-10-CM | POA: Diagnosis not present

## 2022-05-17 DIAGNOSIS — D62 Acute posthemorrhagic anemia: Secondary | ICD-10-CM | POA: Diagnosis present

## 2022-05-17 DIAGNOSIS — F1010 Alcohol abuse, uncomplicated: Secondary | ICD-10-CM | POA: Diagnosis not present

## 2022-05-17 DIAGNOSIS — M109 Gout, unspecified: Secondary | ICD-10-CM | POA: Diagnosis present

## 2022-05-17 DIAGNOSIS — Z7982 Long term (current) use of aspirin: Secondary | ICD-10-CM

## 2022-05-17 DIAGNOSIS — Z8249 Family history of ischemic heart disease and other diseases of the circulatory system: Secondary | ICD-10-CM

## 2022-05-17 DIAGNOSIS — D638 Anemia in other chronic diseases classified elsewhere: Secondary | ICD-10-CM | POA: Diagnosis present

## 2022-05-17 DIAGNOSIS — K429 Umbilical hernia without obstruction or gangrene: Secondary | ICD-10-CM | POA: Diagnosis not present

## 2022-05-17 DIAGNOSIS — I110 Hypertensive heart disease with heart failure: Secondary | ICD-10-CM | POA: Diagnosis not present

## 2022-05-17 DIAGNOSIS — E869 Volume depletion, unspecified: Secondary | ICD-10-CM | POA: Diagnosis not present

## 2022-05-17 DIAGNOSIS — E1165 Type 2 diabetes mellitus with hyperglycemia: Secondary | ICD-10-CM | POA: Diagnosis not present

## 2022-05-17 HISTORY — DX: Umbilical hernia without obstruction or gangrene: K42.9

## 2022-05-17 HISTORY — DX: Gastrointestinal hemorrhage, unspecified: K92.2

## 2022-05-17 HISTORY — PX: ESOPHAGOGASTRODUODENOSCOPY: SHX5428

## 2022-05-17 LAB — COMPREHENSIVE METABOLIC PANEL
ALT: 23 U/L (ref 0–44)
AST: 57 U/L — ABNORMAL HIGH (ref 15–41)
Albumin: 2.9 g/dL — ABNORMAL LOW (ref 3.5–5.0)
Alkaline Phosphatase: 29 U/L — ABNORMAL LOW (ref 38–126)
Anion gap: 14 (ref 5–15)
BUN: 25 mg/dL — ABNORMAL HIGH (ref 6–20)
CO2: 23 mmol/L (ref 22–32)
Calcium: 8.1 mg/dL — ABNORMAL LOW (ref 8.9–10.3)
Chloride: 92 mmol/L — ABNORMAL LOW (ref 98–111)
Creatinine, Ser: 1.57 mg/dL — ABNORMAL HIGH (ref 0.61–1.24)
GFR, Estimated: 54 mL/min — ABNORMAL LOW (ref >60)
Glucose, Bld: 175 mg/dL — ABNORMAL HIGH (ref 70–99)
Potassium: 3.1 mmol/L — ABNORMAL LOW (ref 3.5–5.1)
Sodium: 129 mmol/L — ABNORMAL LOW (ref 135–145)
Total Bilirubin: 1.2 mg/dL (ref 0.3–1.2)
Total Protein: 7 g/dL (ref 6.5–8.1)

## 2022-05-17 LAB — GLUCOSE, CAPILLARY: Glucose-Capillary: 153 mg/dL — ABNORMAL HIGH (ref 70–99)

## 2022-05-17 LAB — CBC WITH DIFFERENTIAL/PLATELET
Abs Immature Granulocytes: 0.03 10*3/uL (ref 0.00–0.07)
Basophils Absolute: 0.2 10*3/uL — ABNORMAL HIGH (ref 0.0–0.1)
Basophils Relative: 2 %
Eosinophils Absolute: 0.2 10*3/uL (ref 0.0–0.5)
Eosinophils Relative: 2 %
HCT: 28.7 % — ABNORMAL LOW (ref 39.0–52.0)
Hemoglobin: 10.1 g/dL — ABNORMAL LOW (ref 13.0–17.0)
Immature Granulocytes: 0 %
Lymphocytes Relative: 25 %
Lymphs Abs: 2.4 10*3/uL (ref 0.7–4.0)
MCH: 34 pg (ref 26.0–34.0)
MCHC: 35.2 g/dL (ref 30.0–36.0)
MCV: 96.6 fL (ref 80.0–100.0)
Monocytes Absolute: 0.9 10*3/uL (ref 0.1–1.0)
Monocytes Relative: 9 %
Neutro Abs: 5.8 10*3/uL (ref 1.7–7.7)
Neutrophils Relative %: 62 %
Platelets: 117 10*3/uL — ABNORMAL LOW (ref 150–400)
RBC: 2.97 MIL/uL — ABNORMAL LOW (ref 4.22–5.81)
RDW: 11.5 % (ref 11.5–15.5)
WBC: 9.4 10*3/uL (ref 4.0–10.5)
nRBC: 0 % (ref 0.0–0.2)

## 2022-05-17 LAB — TROPONIN I (HIGH SENSITIVITY): Troponin I (High Sensitivity): 48 ng/L — ABNORMAL HIGH (ref <18)

## 2022-05-17 LAB — ABO/RH: ABO/RH(D): A POS

## 2022-05-17 LAB — PROTIME-INR
INR: 1.6 — ABNORMAL HIGH (ref 0.8–1.2)
Prothrombin Time: 19.3 seconds — ABNORMAL HIGH (ref 11.4–15.2)

## 2022-05-17 LAB — MRSA NEXT GEN BY PCR, NASAL: MRSA by PCR Next Gen: NOT DETECTED

## 2022-05-17 LAB — MAGNESIUM: Magnesium: 1.6 mg/dL — ABNORMAL LOW (ref 1.7–2.4)

## 2022-05-17 LAB — LACTIC ACID, PLASMA: Lactic Acid, Venous: 4.3 mmol/L (ref 0.5–1.9)

## 2022-05-17 SURGERY — EGD (ESOPHAGOGASTRODUODENOSCOPY)
Anesthesia: General

## 2022-05-17 SURGERY — ENTEROSCOPY
Anesthesia: General

## 2022-05-17 MED ORDER — FENTANYL CITRATE (PF) 100 MCG/2ML IJ SOLN
INTRAMUSCULAR | Status: DC | PRN
Start: 2022-05-17 — End: 2022-05-17
  Administered 2022-05-17 (×2): 50 ug via INTRAVENOUS

## 2022-05-17 MED ORDER — SEVOFLURANE IN SOLN
RESPIRATORY_TRACT | Status: AC
  Filled 2022-05-17: qty 250

## 2022-05-17 MED ORDER — DIGOXIN 250 MCG PO TABS
0.2500 mg | ORAL_TABLET | Freq: Every day | ORAL | Status: DC
  Administered 2022-05-17 – 2022-05-20 (×4): 0.25 mg via ORAL
  Filled 2022-05-17 (×4): qty 1

## 2022-05-17 MED ORDER — PHENYLEPHRINE 80 MCG/ML (10ML) SYRINGE FOR IV PUSH (FOR BLOOD PRESSURE SUPPORT)
PREFILLED_SYRINGE | INTRAVENOUS | Status: DC | PRN
  Administered 2022-05-17: 80 ug via INTRAVENOUS
  Administered 2022-05-17: 160 ug via INTRAVENOUS
  Administered 2022-05-17: 80 ug via INTRAVENOUS

## 2022-05-17 MED ORDER — SENNOSIDES-DOCUSATE SODIUM 8.6-50 MG PO TABS: 1.0000 | ORAL_TABLET | Freq: Every evening | ORAL | Status: DC | PRN

## 2022-05-17 MED ORDER — PROPOFOL 10 MG/ML IV BOLUS
INTRAVENOUS | Status: AC
  Filled 2022-05-17: qty 20

## 2022-05-17 MED ORDER — INSULIN ASPART 100 UNIT/ML IJ SOLN: 0.0000 [IU] | Freq: Every day | INTRAMUSCULAR | Status: DC

## 2022-05-17 MED ORDER — THIAMINE HCL 100 MG PO TABS
100.0000 mg | ORAL_TABLET | Freq: Every day | ORAL | Status: DC
  Administered 2022-05-17 – 2022-05-20 (×4): 100 mg via ORAL
  Filled 2022-05-17 (×4): qty 1

## 2022-05-17 MED ORDER — POTASSIUM CHLORIDE 10 MEQ/100ML IV SOLN
10.0000 meq | INTRAVENOUS | Status: AC
  Administered 2022-05-17: 10 meq via INTRAVENOUS

## 2022-05-17 MED ORDER — SUCCINYLCHOLINE CHLORIDE 200 MG/10ML IV SOSY
PREFILLED_SYRINGE | INTRAVENOUS | Status: DC | PRN
  Administered 2022-05-17: 100 mg via INTRAVENOUS

## 2022-05-17 MED ORDER — CHLORHEXIDINE GLUCONATE CLOTH 2 % EX PADS
6.0000 | MEDICATED_PAD | Freq: Every day | CUTANEOUS | Status: DC
  Administered 2022-05-17: 6 via TOPICAL

## 2022-05-17 MED ORDER — ONDANSETRON HCL 4 MG PO TABS: 4.0000 mg | ORAL_TABLET | Freq: Four times a day (QID) | ORAL | Status: DC | PRN

## 2022-05-17 MED ORDER — FENTANYL CITRATE (PF) 100 MCG/2ML IJ SOLN
INTRAMUSCULAR | Status: AC
  Filled 2022-05-17: qty 2

## 2022-05-17 MED ORDER — ACETAMINOPHEN 650 MG RE SUPP: 650.0000 mg | Freq: Four times a day (QID) | RECTAL | Status: DC | PRN

## 2022-05-17 MED ORDER — OCTREOTIDE LOAD VIA INFUSION
50.0000 ug | Freq: Once | INTRAVENOUS | Status: AC
  Administered 2022-05-17: 50 ug via INTRAVENOUS
  Filled 2022-05-17: qty 25

## 2022-05-17 MED ORDER — MELATONIN 5 MG PO TABS
5.0000 mg | ORAL_TABLET | Freq: Every evening | ORAL | Status: DC | PRN
Start: 2022-05-17 — End: 2022-05-20

## 2022-05-17 MED ORDER — ACETAMINOPHEN 325 MG PO TABS: 650.0000 mg | ORAL_TABLET | Freq: Four times a day (QID) | ORAL | Status: DC | PRN

## 2022-05-17 MED ORDER — LIDOCAINE HCL (CARDIAC) PF 100 MG/5ML IV SOSY
PREFILLED_SYRINGE | INTRAVENOUS | Status: DC | PRN
  Administered 2022-05-17: 80 mg via INTRAVENOUS

## 2022-05-17 MED ORDER — ONDANSETRON HCL 4 MG/2ML IJ SOLN
INTRAMUSCULAR | Status: DC | PRN
  Administered 2022-05-17: 4 mg via INTRAVENOUS

## 2022-05-17 MED ORDER — ATORVASTATIN CALCIUM 20 MG PO TABS
20.0000 mg | ORAL_TABLET | Freq: Every day | ORAL | Status: DC
  Administered 2022-05-17 – 2022-05-20 (×4): 20 mg via ORAL
  Filled 2022-05-17 (×4): qty 1

## 2022-05-17 MED ORDER — SODIUM CHLORIDE 0.9 % IV BOLUS
500.0000 mL | Freq: Once | INTRAVENOUS | Status: AC
  Administered 2022-05-17: 500 mL via INTRAVENOUS

## 2022-05-17 MED ORDER — DEXAMETHASONE SODIUM PHOSPHATE 10 MG/ML IJ SOLN
INTRAMUSCULAR | Status: DC | PRN
  Administered 2022-05-17: 10 mg via INTRAVENOUS

## 2022-05-17 MED ORDER — SODIUM CHLORIDE 0.9 % IV SOLN: 10.0000 mL/h | Freq: Once | INTRAVENOUS | Status: DC

## 2022-05-17 MED ORDER — MIDAZOLAM HCL 2 MG/2ML IJ SOLN
INTRAMUSCULAR | Status: AC
  Filled 2022-05-17: qty 2

## 2022-05-17 MED ORDER — EPHEDRINE SULFATE (PRESSORS) 50 MG/ML IJ SOLN
INTRAMUSCULAR | Status: DC | PRN
  Administered 2022-05-17: 5 mg via INTRAVENOUS

## 2022-05-17 MED ORDER — LORAZEPAM 1 MG PO TABS: 1.0000 mg | ORAL_TABLET | ORAL | Status: DC | PRN

## 2022-05-17 MED ORDER — MAGNESIUM SULFATE IN D5W 1-5 GM/100ML-% IV SOLN
1.0000 g | Freq: Once | INTRAVENOUS | Status: AC
  Administered 2022-05-17: 1 g via INTRAVENOUS
  Filled 2022-05-17 (×2): qty 100

## 2022-05-17 MED ORDER — STERILE WATER FOR IRRIGATION IR SOLN
Status: DC | PRN
  Administered 2022-05-17: 60 mL

## 2022-05-17 MED ORDER — PROPOFOL 10 MG/ML IV BOLUS
INTRAVENOUS | Status: DC | PRN
  Administered 2022-05-17: 200 mg via INTRAVENOUS

## 2022-05-17 MED ORDER — ONDANSETRON HCL 4 MG/2ML IJ SOLN
4.0000 mg | Freq: Once | INTRAMUSCULAR | Status: AC
  Administered 2022-05-17: 4 mg via INTRAVENOUS
  Filled 2022-05-17: qty 2

## 2022-05-17 MED ORDER — FOLIC ACID 1 MG PO TABS
1.0000 mg | ORAL_TABLET | Freq: Every day | ORAL | Status: DC
  Administered 2022-05-17 – 2022-05-20 (×4): 1 mg via ORAL
  Filled 2022-05-17 (×4): qty 1

## 2022-05-17 MED ORDER — PANTOPRAZOLE 80MG IVPB - SIMPLE MED
80.0000 mg | Freq: Once | INTRAVENOUS | Status: AC
  Administered 2022-05-17: 80 mg via INTRAVENOUS
  Filled 2022-05-17: qty 100

## 2022-05-17 MED ORDER — INSULIN ASPART 100 UNIT/ML IJ SOLN
0.0000 [IU] | Freq: Three times a day (TID) | INTRAMUSCULAR | Status: DC
  Administered 2022-05-18 (×2): 3 [IU] via SUBCUTANEOUS
  Administered 2022-05-18: 5 [IU] via SUBCUTANEOUS
  Administered 2022-05-19: 2 [IU] via SUBCUTANEOUS
  Administered 2022-05-19: 5 [IU] via SUBCUTANEOUS
  Administered 2022-05-19: 2 [IU] via SUBCUTANEOUS
  Administered 2022-05-20: 5 [IU] via SUBCUTANEOUS
  Administered 2022-05-20: 2 [IU] via SUBCUTANEOUS
  Filled 2022-05-17 (×8): qty 1

## 2022-05-17 MED ORDER — ADULT MULTIVITAMIN W/MINERALS CH
1.0000 | ORAL_TABLET | Freq: Every day | ORAL | Status: DC
  Administered 2022-05-17 – 2022-05-20 (×4): 1 via ORAL
  Filled 2022-05-17 (×4): qty 1

## 2022-05-17 MED ORDER — POTASSIUM CHLORIDE 10 MEQ/100ML IV SOLN
10.0000 meq | INTRAVENOUS | Status: AC
  Administered 2022-05-17 (×3): 10 meq via INTRAVENOUS
  Filled 2022-05-17 (×4): qty 100

## 2022-05-17 MED ORDER — CARVEDILOL 3.125 MG PO TABS: 3.1250 mg | ORAL_TABLET | Freq: Two times a day (BID) | ORAL | Status: DC

## 2022-05-17 MED ORDER — CARVEDILOL 3.125 MG PO TABS
3.1250 mg | ORAL_TABLET | Freq: Two times a day (BID) | ORAL | Status: DC
  Administered 2022-05-18 – 2022-05-20 (×5): 3.125 mg via ORAL
  Filled 2022-05-17 (×5): qty 1

## 2022-05-17 MED ORDER — SODIUM CHLORIDE 0.9 % IV SOLN: Freq: Once | INTRAVENOUS | Status: AC

## 2022-05-17 MED ORDER — LACTATED RINGERS IV SOLN: INTRAVENOUS | Status: DC | PRN

## 2022-05-17 MED ORDER — SODIUM CHLORIDE 0.9 % IV SOLN
50.0000 ug/h | INTRAVENOUS | Status: DC
  Administered 2022-05-17 – 2022-05-20 (×6): 50 ug/h via INTRAVENOUS
  Filled 2022-05-17 (×11): qty 1

## 2022-05-17 MED ORDER — THIAMINE HCL 100 MG/ML IJ SOLN: 100.0000 mg | Freq: Every day | INTRAMUSCULAR | Status: DC

## 2022-05-17 MED ORDER — LORAZEPAM 2 MG/ML IJ SOLN: 1.0000 mg | INTRAMUSCULAR | Status: DC | PRN

## 2022-05-17 MED ORDER — ONDANSETRON HCL 4 MG/2ML IJ SOLN: 4.0000 mg | Freq: Four times a day (QID) | INTRAMUSCULAR | Status: DC | PRN

## 2022-05-17 MED ORDER — ORAL CARE MOUTH RINSE: 15.0000 mL | OROMUCOSAL | Status: DC | PRN

## 2022-05-17 MED ORDER — MIDAZOLAM HCL 2 MG/2ML IJ SOLN
INTRAMUSCULAR | Status: DC | PRN
  Administered 2022-05-17: 2 mg via INTRAVENOUS

## 2022-05-17 NOTE — Hospital Course (Addendum)
Mr. Rodarius Kichline is a 50 year old male with nonischemic cardiomyopathy status post ICD, alcohol abuse, hypertension, obesity, hyperlipidemia, hepatic steatosis, who presents emergency department for chief concerns of hematemesis.  Initial vitals in the emergency department showed temperature of 98, respiration rate 18, heart rate 60, blood pressure initially was 82/53, SPO2 97% on room air.  Serum serum is 139, potassium 3.1, chloride 92, bicarb 23, BUN of 25, serum creatinine 1.57, GFR 54, WBC 9.4, hemoglobin 10.1, platelets 117, alk phos was elevated at 29, high sensitive troponin was 48, lactic acid was 4.3.  Of note baseline hemoglobin was 15.1 on 03/01/2020.  EDP consulted GI who will take patient to the endoscopy room today.  ED treatment octreotide gtt., ondansetron 4 mg IV one-time dose, Protonix gtt., sodium chloride 500 mL bolus x2.  7/1: Patient underwent EGD with GI yesterday.  Found to have grade 2 varices s/p banding.  Also found to have 2 oozing gastric ulcers s/p clipped Also has multiple angiodysplastic lesions s/p hemostasis was achieved with argon plasma. Lactic acid remained elevated.  Giving some IV fluid. Patient was placed on octreotide infusion-for 72 hours. Received 1 dose of IV Protonix-starting IV twice daily Protonix. No prior diagnosis of liver cirrhosis, history of alcohol abuse.  RUQ ultrasound ordered Also starting him on ceftriaxone for esophageal varices prophylaxis. Also had AKI-giving some gentle of fluid.  7/2: Hemoglobin at 10 today.  Leukocytosis resolved.  Platelets at 70 and no obvious bleeding. Octreotide infusion was stopped without any orders, discussed with nursing stop and we restarted octreotide to complete 72 hours as recommended by GI. Right upper quadrant ultrasound with hepatic steatosis and possible cirrhosis. Anemia panel with anemia of chronic disease, B12 still pending.  7/3: Patient remained stable.  No more bleeding.  Hemoglobin slowly  decreasing, at 9.5 today.  Platelets at 64.  We held his home aspirin which can be resumed by his cardiologist or primary care provider once platelets started improving. Renal function stable but remained above his baseline which was done a year ago.  We held his home dose of Lasix and Entresto.  Needed close follow-up with cardiology and if renal functions remained stable they can restart.  He was also instructed to weigh himself daily and if sees any increase in weight should start taking Lasix and follow-up with his cardiologist. Anemia panel consistent with anemia of chronic disease.  B12 within lower normal limit at 360. INR remains stable at 1.4, albumin at 2.7.  Patient will need close monitoring of his liver function. Patient was counseled against alcohol use.  He will need continuation of counseling which can be done by his PCP.  Patient also need to have follow-up with gastroenterology.  Patient will continue with current medications and need to have a close follow-up with his providers.

## 2022-05-17 NOTE — H&P (Addendum)
History and Physical   TACUMA GRAFFAM IPJ:[id number] DOB: 07/30/1971 DOA: 05/17/2022  PCP: Mar Daring, PA-C  Patient coming from: Home  I have personally briefly reviewed patient's old medical records in [place].  Chief Concern: Hematemesis  HPI: Mr. Rue Tinnel is a 51 year old male with nonischemic cardiomyopathy status post ICD, alcohol abuse, hypertension, obesity, hyperlipidemia, hepatic steatosis, who presents emergency department for chief concerns of hematemesis.  Initial vitals in the emergency department showed temperature of 98, respiration rate 18, heart rate 60, blood pressure initially was 82/53, SPO2 97% on room air.  Serum serum is 139, potassium 3.1, chloride 92, bicarb 23, BUN of 25, serum creatinine 1.57, GFR 54, WBC 9.4, hemoglobin 10.1, platelets 117, alk phos was elevated at 29, high sensitive troponin was 48, lactic acid was 4.3.  Of note baseline hemoglobin was 15.1 on 03/01/2020.  EDP consulted GI who will take patient to the endoscopy room today.  ED treatment octreotide gtt., ondansetron 4 mg IV one-time dose, Protonix gtt., sodium chloride 500 mL bolus x2.  At bedside he is able to tell me his name, his age, he knows he is in the hospital and he was able to identify his wife and mother at bedside.  He reports that he woke up this morning with 3 episodes of bloody vomitus along with black clots.  He reports this never happened before.  He denies chest pain, abdominal pain, fever, chills, cough, dysuria, hematuria, diarrhea, blood in his stool and or melena.  He reports his last bowel movement was 05/16/2022 and was normal color.  She denies NSAID use. He does take an aspirin 81 mg daily.  Alcohol use: He denies known history of tremors or seizures without alcohol however he does not remember the last time he was sober for 3 days.  Social history: He lives with his wife at home. He denies tobacco and recreational drug use. He is a current daily  alcohol user drinking at least 6 beers per day. He does not remember the last time he was without alcohol for 3 days.  ROS: Constitutional: no weight change, no fever ENT/Mouth: no sore throat, no rhinorrhea Eyes: no eye pain, no vision changes Cardiovascular: no chest pain, no dyspnea,  no edema, no palpitations Respiratory: no cough, no sputum, no wheezing Gastrointestinal: + nausea, + vomiting, no diarrhea, no constipation Genitourinary: no urinary incontinence, no dysuria, no hematuria Musculoskeletal: no arthralgias, no myalgias Skin: no skin lesions, no pruritus, Neuro: + weakness, no loss of consciousness, no syncope Psych: no anxiety, no depression, no decrease appetite Heme/Lymph: no bruising, no bleeding  ED Course: Discussed with emergency medicine provider, patient requiring hospitalization for chief concerns of upper GI bleed.  Assessment/Plan  Principal Problem:   Upper GI bleed Active Problems:   Gout   HLD (hyperlipidemia)   Left bundle branch block   Nonischemic dilated cardiomyopathy (HCC)   Diabetes mellitus type 2, noninsulin dependent (HCC)   Acute blood loss anemia   Hypokalemia   Alcohol abuse   Hypomagnesemia   Assessment and Plan:  * Upper GI bleed - Presumed secondary to chronic alcohol use - Differential diagnosis includes gastric ulcer versus esophageal varices bleed - Continue Protonix gtt. - Continue octreotide GGT - GI, Dr. Allen Norris has been consulted we appreciate further recommendations and guidance - Admit to stepdown, inpatient  Hypomagnesemia - Magnesium 1 g IV one-time dose ordered - Check magnesium level in the a.m.  Alcohol abuse - CIWA precaution placed - Low  clinical suspicion for a respiratory on day of admission as patient's last drink was evening of 05/16/2022  Hypokalemia - Presumed secondary to GI loss - Check magnesium level - Status post potassium chloride 10 mill equivalent IV every hour, 2 doses ordered - BMP in the  a.m.  Acute blood loss anemia - Secondary to upper GI bleed - Type and screen - 2 units PRBC has been ordered for transfusion - Ensure patient has 2 peripheral IV large-bore - Repeat hemoglobin and hematocrit scheduled for 20:00 on day of admission - Goal hemoglobin level maintain greater than 8  Diabetes mellitus type 2, noninsulin dependent (HCC) - Metformin has not been resumed while inpatient - Insulin SSI with at bedtime coverage ordered - Goal inpatient blood glucose levels 140-100 and  Nonischemic dilated cardiomyopathy ([place]) - resumed home digoxin 0.25 mg daily today and coreg for 05/18/22 - Holding entresto at this time due to hypotension in setting of UGI  HLD (hyperlipidemia) - Atorvastatin 40 mg daily resumed  AM team to complete med reconciliation.   DVT prophylaxis-I have not ordered pharmacologic DVT prophylaxis on admission due to patient presenting with hematemesis and acute anemia with no prior history of anemia.  He is admitted for upper GI bleed - AM team to initiate pharmacologic DVT prophylaxis when the benefits outweigh the risks  Chart reviewed.   DVT prophylaxis: TED hose Code Status: Full code Diet: N.p.o. except for sips with meds at this time Family Communication: Updated mother and spouse at bedside with patient's permission Disposition Plan: Pending clinical course Consults called: GI, Dr. Allen Norris Admission status: Stepdown, inpatient  Past Medical History:  Diagnosis Date   AICD (automatic cardioverter/defibrillator) present    Cardiomyopathy, nonischemic ([place])    GI bleed    Hyperlipemia    Hypertension    Presence of permanent cardiac pacemaker    Umbilical hernia    Past Surgical History:  Procedure Laterality Date   CARDIAC SURGERY     pacemaker placed in 2010, in 2014 batteries were replased   INSERT / REPLACE / REMOVE PACEMAKER     Social History:  reports that he has never smoked. He has never used smokeless tobacco. He reports  current alcohol use of about 42.0 standard drinks of alcohol per week. He reports that he does not use drugs.  No Known Allergies Family History  Problem Relation Age of Onset   Heart disease Mother    Cirrhosis Maternal Grandfather    Alzheimer's disease Paternal Grandmother    Cirrhosis Paternal Grandfather    Heart disease Maternal Grandmother    Family history: Family history reviewed and not pertinent  Prior to Admission medications   Medication Sig Start Date End Date Taking? Authorizing Provider  allopurinol (ZYLOPRIM) 100 MG tablet Take 300 mg by mouth daily.     [provider]  aspirin 81 MG tablet Take 81 mg by mouth daily.     [provider]  atorvastatin (LIPITOR) 20 MG tablet Take 1 tablet (20 mg total) by mouth daily. 10/01/19   Mar Daring, PA-C  blood glucose meter kit and supplies KIT Dispense based on patient and insurance preference. Use up to four times daily as directed. (FOR ICD-9 250.00, 250.01). 05/30/18   Dustin Flock, MD  carvedilol (COREG) 3.125 MG tablet Take 3.125 mg by mouth 2 (two) times daily with a meal.     [provider]  digoxin (LANOXIN) 0.25 MG tablet Take 0.25 mg by mouth daily.  [provider]  doxycycline (VIBRA-TABS) 100 MG tablet Take 1 tablet (100 mg total) by mouth 2 (two) times daily. 04/22/20   Trula Slade, DPM  ENTRESTO 24-26 MG Take 1 tablet by mouth 2 (two) times daily.  02/13/17   [provider]  furosemide (LASIX) 20 MG tablet Take 40 mg by mouth daily.     [provider]  metFORMIN (GLUCOPHAGE) 1000 MG tablet Take 1,000 mg by mouth daily. 01/04/20   [provider]  metFORMIN (GLUCOPHAGE) 500 MG tablet Take 1 tablet (500 mg total) by mouth daily with breakfast. 03/27/20   Mar Daring, PA-C  ondansetron (ZOFRAN ODT) 4 MG disintegrating tablet Take 1 tablet (4 mg total) by mouth every 8 (eight) hours as needed for nausea or vomiting. 03/01/20    Duffy Bruce, MD  potassium chloride SA (KLOR-CON) 20 MEQ tablet Take 1 tablet (20 mEq total) by mouth daily. Please schedule an office visit before anymore refills. 03/27/21   Virginia Crews, MD  silver sulfADIAZINE (SILVADENE) 1 % cream Apply 1 application topically daily. 04/22/20   Trula Slade, DPM   Physical Exam: Vitals:   05/17/22 1630 05/17/22 1700 05/17/22 1756 05/17/22 1905  BP: 121/70 130/70  107/61  Pulse: 88 75    Resp: 16 20    Temp: 97.6 F (36.4 C) 98 F (36.7 C) 98 F (36.7 C) 98 F (36.7 C)  TempSrc: Oral Temporal Temporal Oral  SpO2: 91% 98%    Weight:    94.9 kg  Height:    '5\' 9"'  (1.753 m)   Constitutional: appears older than chronological age, NAD, calm, comfortable Eyes: PERRL, lids and conjunctivae normal ENMT: Mucous membranes are moist. Posterior pharynx clear of any exudate or lesions. Age-appropriate dentition. Hearing appropriate Neck: normal, supple, no masses, no thyromegaly Respiratory: clear to auscultation bilaterally, no wheezing, no crackles. Normal respiratory effort. No accessory muscle use.  Cardiovascular: Regular rate and rhythm, no murmurs / rubs / gallops. No extremity edema. 2+ pedal pulses. No carotid bruits.  Abdomen: Obese abdomen, no tenderness, no masses palpated, no hepatosplenomegaly. Bowel sounds positive.  Musculoskeletal: no clubbing / cyanosis. No joint deformity upper and lower extremities. Good ROM, no contractures, no atrophy. Normal muscle tone.  Skin: no rashes, lesions, ulcers. No induration Neurologic: Sensation intact. Strength 5/5 in all 4.  Psychiatric: Normal judgment and insight. Alert and oriented x 3. Normal mood.   EKG: independently reviewed, showing ventricular paced with rate of 76, QTc 501  Chest x-ray on Admission: Ordered for day of admission at 20:00 for post transfusion  Labs on Admission: I have personally reviewed following labs  CBC: Recent Labs  Lab 05/17/22 1430  WBC 9.4   NEUTROABS 5.8  HGB 10.1*  HCT 28.7*  MCV 96.6  PLT 017*   Basic Metabolic Panel: Recent Labs  Lab 05/17/22 1430  NA 129*  K 3.1*  CL 92*  CO2 23  GLUCOSE 175*  BUN 25*  CREATININE 1.57*  CALCIUM 8.1*  MG 1.6*   GFR: Estimated Creatinine Clearance: 65.4 mL/min (A) (by C-G formula based on SCr of 1.57 mg/dL (H)).  Liver Function Tests: Recent Labs  Lab 05/17/22 1430  AST 57*  ALT 23  ALKPHOS 29*  BILITOT 1.2  PROT 7.0  ALBUMIN 2.9*   Coagulation Profile: Recent Labs  Lab 05/17/22 1601  INR 1.6*   Urine analysis:    Component Value Date/Time   COLORURINE AMBER (A) 03/01/2020 1122   APPEARANCEUR HAZY (  A) 03/01/2020 1122   LABSPEC 1.013 03/01/2020 1122   PHURINE 6.0 03/01/2020 1122   GLUCOSEU NEGATIVE 03/01/2020 1122   HGBUR LARGE (A) 03/01/2020 1122   BILIRUBINUR NEGATIVE 03/01/2020 1122   BILIRUBINUR negatve 04/10/2017 1531   KETONESUR NEGATIVE 03/01/2020 1122   PROTEINUR 100 (A) 03/01/2020 1122   UROBILINOGEN 4.0 (A) 04/10/2017 1531   NITRITE NEGATIVE 03/01/2020 1122   LEUKOCYTESUR NEGATIVE 03/01/2020 1122   Dr. Tobie Poet [facility]  If 7PM-7AM, please contact overnight-coverage provider If 7AM-7PM, please contact day coverage provider [email or web address]  05/17/2022, 7:27 PM

## 2022-05-17 NOTE — ED Triage Notes (Signed)
Patient reports blood in emesis X3 today and family reports witnessing a seizure once arriving to ER. Patient appears pale and reports feeling weak.

## 2022-05-17 NOTE — ED Notes (Signed)
Report given to Endo at this time.

## 2022-05-17 NOTE — Assessment & Plan Note (Signed)
-   CIWA precaution placed - Low clinical suspicion for a respiratory on day of admission as patient's last drink was evening of 05/16/2022

## 2022-05-17 NOTE — Assessment & Plan Note (Addendum)
-   Secondary to upper GI bleed Received 2 unit of PRBC. -Continue to monitor -Transfuse if below 8

## 2022-05-17 NOTE — Anesthesia Procedure Notes (Signed)
Procedure Name: Intubation Date/Time: 05/17/2022 5:18 PM  Performed by: Lily Peer, Asusena Sigley, CRNAPre-anesthesia Checklist: Patient identified, Emergency Drugs available, Suction available and Patient being monitored Patient Re-evaluated:Patient Re-evaluated prior to induction Oxygen Delivery Method: Circle system utilized Preoxygenation: Pre-oxygenation with 100% oxygen Induction Type: IV induction Ventilation: Mask ventilation without difficulty Laryngoscope Size: McGraph and 4 Grade View: Grade I Tube type: Oral Tube size: 7.5 mm Number of attempts: 1 Airway Equipment and Method: Stylet Placement Confirmation: ETT inserted through vocal cords under direct vision, positive ETCO2 and breath sounds checked- equal and bilateral Secured at: 21 cm Tube secured with: Tape Dental Injury: Teeth and Oropharynx as per pre-operative assessment

## 2022-05-17 NOTE — Assessment & Plan Note (Signed)
-   Metformin has not been resumed while inpatient - Insulin SSI with at bedtime coverage ordered - Goal inpatient blood glucose levels 140-100 and

## 2022-05-17 NOTE — Assessment & Plan Note (Signed)
Resolved. -Continue to monitor

## 2022-05-17 NOTE — Assessment & Plan Note (Signed)
Resolved with repletion -Continue to monitor and replete as needed.

## 2022-05-17 NOTE — Assessment & Plan Note (Addendum)
-   resumed home digoxin 0.25 mg daily today and coreg for 05/18/22 - Holding entresto at this time due to hypotension in setting of UGI

## 2022-05-17 NOTE — ED Notes (Signed)
Patient taken to endo by endo RN's at this time.

## 2022-05-17 NOTE — Op Note (Signed)
[facility] Gastroenterology Patient Name: Cody [name] Procedure Date: 05/17/2022 4:34 PM MRN: [id number] Account #: [id number] Date of Birth: [birth year] Admit Type: Inpatient Age: 51 Room: [facility] ENDO ROOM 4 Gender: Male Note Status: Finalized Instrument Name: Upper Endoscope [id number] Procedure:             Upper GI endoscopy Indications:           Hematemesis Providers:             [name] MD, MD Referring MD:          No Local Md, MD (Referring MD) Medicines:             General Anesthesia Complications:         No immediate complications. Procedure:             Pre-Anesthesia Assessment:                        - Prior to the procedure, a History and Physical was                         performed, and patient medications and allergies were                         reviewed. The patient's tolerance of previous                         anesthesia was also reviewed. The risks and benefits                         of the procedure and the sedation options and risks                         were discussed with the patient. All questions were                         answered, and informed consent was obtained. Prior                         Anticoagulants: The patient has taken no previous                         anticoagulant or antiplatelet agents. ASA Grade                         Assessment: II - A patient with mild systemic disease.                         After reviewing the risks and benefits, the patient                         was deemed in satisfactory condition to undergo the                         procedure.                        After obtaining informed consent, the endoscope was  passed under direct vision. Throughout the procedure,                         the patient's blood pressure, pulse, and oxygen                         saturations were monitored continuously. The Endoscope                         was introduced through the  mouth, and advanced to the                         second part of duodenum. Findings:      Grade II varices were found in the lower third of the esophagus. Two       bands were successfully placed with complete eradication, resulting in       deflation of varices.      Clotted blood was found in the entire examined stomach.      Two oozing cratered gastric ulcers were found in the cardia. To prevent       bleeding post-intervention, two hemostatic clips were successfully       placed (MR conditional). There was no bleeding at the end of the       procedure.      Multiple angiodysplastic lesions with bleeding were found in the gastric       antrum. Coagulation for hemostasis using argon plasma at 2 liters/minute       and 30 watts was successful.      The examined duodenum was normal. Impression:            - Grade II esophageal varices. Completely eradicated.                         Banded.                        - Clotted blood in the entire stomach.                        - Oozing gastric ulcers. Clips (MR conditional) were                         placed.                        - Multiple bleeding angiodysplastic lesions in the                         stomach. Treated with argon plasma coagulation (APC).                        - Normal examined duodenum.                        - No specimens collected. Recommendation:        - Return patient to hospital ward for ongoing care.                        - NPO.                        -  Continue present medications. Procedure Code(s):     --- Professional ---                        [phone], Esophagogastroduodenoscopy, flexible,                         transoral; with band ligation of esophageal/gastric                         varices                        [id number], 59, Esophagogastroduodenoscopy, flexible,                         transoral; with control of bleeding, any method Diagnosis Code(s):     --- Professional ---                         K92.0, Hematemesis                        K92.2, Gastrointestinal hemorrhage, unspecified                        K25.4, Chronic or unspecified gastric ulcer with                         hemorrhage CPT copyright 2019 American Medical Association. All rights reserved. The codes documented in this report are preliminary and upon coder review may  be revised to meet current compliance requirements. [name] MD, MD 05/17/2022 5:52:30 PM This report has been signed electronically. Number of Addenda: 0 Note Initiated On: 05/17/2022 4:34 PM Estimated Blood Loss:  Estimated blood loss: none.      [facility]

## 2022-05-17 NOTE — Assessment & Plan Note (Signed)
Patient underwent EGD yesterday and found to have grade 2 varices s/p banding, also found to have a oozing gastric ulcer and clips was placed.  Multiple angiodysplastic lesions s/p hemostasis obtained with argon plasma. Currently stable with no active bleeding. No prior diagnosis of cirrhosis but patient has an history of alcohol abuse. -Right upper quadrant ultrasound -Continue with octreotide for total of 72 hours -Protonix IV twice daily -Start him on ceftriaxone for esophageal varices prophylaxis.

## 2022-05-17 NOTE — Assessment & Plan Note (Signed)
-   Atorvastatin 40 mg daily resumed 

## 2022-05-17 NOTE — ED Notes (Signed)
Critical result: Lactic acid 4.3  Quale, MD aware

## 2022-05-17 NOTE — Anesthesia Preprocedure Evaluation (Addendum)
Anesthesia Evaluation  Patient identified by MRN, date of birth, ID band Patient awake    Reviewed: Allergy & Precautions, H&P , NPO status , Patient's Chart, lab work & pertinent test results  History of Anesthesia Complications Negative for: history of anesthetic complications  Airway Mallampati: II  TM Distance: >3 FB Neck ROM: full    Dental   Pulmonary sleep apnea , neg COPD,    breath sounds clear to auscultation       Cardiovascular Exercise Tolerance: Good hypertension, (-) angina+CHF (HFpEF. NICM)  (-) Past MI and (-) Cardiac Stents + dysrhythmias (NSVT, LBBB) + pacemaker + Cardiac Defibrillator  Rhythm:regular Rate:Normal  hx of NSVT, LBBB, NICM s/p BiV ICD with recovery of EF 50%  Echo 05/01/22: INTERPRETATION  NORMAL LEFT VENTRICULAR SYSTOLIC FUNCTION WITH AN ESTIMATED EF = 50-55 %  NORMAL RIGHT VENTRICULAR SYSTOLIC FUNCTION  MILD VALVULAR REGURGITATION (See above)  NO VALVULAR STENOSIS  MODERATELY DILATED AORTIC ROOT MEASURING 4.7 CM  MODERATELY DILATED ASCENDING AORTA MEASURING 4.9 CM  Pacer Lead in RV  Consider referal to Vascular   Atrial-sensed ventricular-paced rhythm  Abnormal ECG   HLD   Neuro/Psych negative neurological ROS  negative psych ROS   GI/Hepatic negative GI ROS, Neg liver ROS, Upper GIB presenting for emergent EGD   Endo/Other  diabetesobesity  Renal/GU      Musculoskeletal   Abdominal   Peds  Hematology negative hematology ROS (+)   Anesthesia Other Findings Past Medical History: No date: AICD (automatic cardioverter/defibrillator) present No date: Cardiomyopathy, nonischemic (HCC) No date: GI bleed No date: Hyperlipemia No date: Hypertension No date: Presence of permanent cardiac pacemaker No date: Umbilical hernia  Past Surgical History: No date: CARDIAC SURGERY     Comment:  pacemaker placed in 2010, in 2014 batteries were               replased No date: INSERT  / REPLACE / REMOVE PACEMAKER  BMI    Body Mass Index: 30.90 kg/m      Reproductive/Obstetrics negative OB ROS                            Anesthesia Physical Anesthesia Plan  ASA: 3 and emergent  Anesthesia Plan: General   Post-op Pain Management:    Induction:   PONV Risk Score and Plan: Ondansetron, Dexamethasone, Midazolam and Treatment may vary due to age or medical condition  Airway Management Planned:   Additional Equipment:   Intra-op Plan:   Post-operative Plan:   Informed Consent: I have reviewed the patients History and Physical, chart, labs and discussed the procedure including the risks, benefits and alternatives for the proposed anesthesia with the patient or authorized representative who has indicated his/her understanding and acceptance.     Dental Advisory Given  Plan Discussed with: Anesthesiologist, CRNA and Surgeon  Anesthesia Plan Comments:        Anesthesia Quick Evaluation  GETA RSI Standard monitors Informed consent obtained.  K Chrystine Frogge

## 2022-05-17 NOTE — Consult Note (Signed)
Cody Lame, MD [facility]  [address] [place], [place] [address] Phone: [phone] Fax : [phone]  Consultation  Referring Provider:     Dr. Jacqualine Code Primary Care Physician:  Mar Daring, PA-C Primary Gastroenterologist:     Althia Forts      Reason for Consultation:     Hematemesis  Date of Admission:  05/17/2022 Date of Consultation:  05/17/2022         HPI:   Cody [name] is a 51 y.o. male who has a history of CHF and is followed by cardiology.  The patient also reports that he has been drinking 7 beers a day for many years.  He denies any NSAID use but takes only Tylenol.  There is no report of any previous history of seeing a GI doctor.  The patient has never had a colonoscopy.  The patient also reports that he has never had any GI bleeding in the past.  There is no report of any black stools or bloody stools since the bleeding started.  The patient had 2 episodes of bleeding this morning around 9:00.  He has not had any further bleeding until he got into the car to come to the hospital and that was around noon time today.  The patient was found to have some hypotensive blood pressures but has been around a systolic of 354.  The patient has been typed and cross is supposed to get 2 units of blood.  The patient has not been given any IV fluid boluses due to his history of heart disease.  The patient denies ever being told that he should stop drinking.  He was noted to have thrombocytopenia and hyponatremia.  His hemoglobin has been around 15 usually and is now down to 10.  Past Medical History:  Diagnosis Date   AICD (automatic cardioverter/defibrillator) present    Cardiomyopathy, nonischemic ([place])    Hyperlipemia    Hypertension    Presence of permanent cardiac pacemaker     Past Surgical History:  Procedure Laterality Date   CARDIAC SURGERY     pacemaker placed in 2010, in 2014 batteries were replased   INSERT / REPLACE / REMOVE PACEMAKER      Prior to  Admission medications   Medication Sig Start Date End Date Taking? Authorizing Provider  allopurinol (ZYLOPRIM) 100 MG tablet Take 300 mg by mouth daily.     [provider]  aspirin 81 MG tablet Take 81 mg by mouth daily.     [provider]  atorvastatin (LIPITOR) 20 MG tablet Take 1 tablet (20 mg total) by mouth daily. 10/01/19   Mar Daring, PA-C  blood glucose meter kit and supplies KIT Dispense based on patient and insurance preference. Use up to four times daily as directed. (FOR ICD-9 250.00, 250.01). 05/30/18   Dustin Flock, MD  carvedilol (COREG) 3.125 MG tablet Take 3.125 mg by mouth 2 (two) times daily with a meal.     [provider]  digoxin (LANOXIN) 0.25 MG tablet Take 0.25 mg by mouth daily.    [provider]  doxycycline (VIBRA-TABS) 100 MG tablet Take 1 tablet (100 mg total) by mouth 2 (two) times daily. 04/22/20   Trula Slade, DPM  ENTRESTO 24-26 MG Take 1 tablet by mouth 2 (two) times daily.  02/13/17   [provider]  furosemide (LASIX) 20 MG tablet Take 40 mg by mouth daily.     [provider]  metFORMIN (  GLUCOPHAGE) 1000 MG tablet Take 1,000 mg by mouth daily. 01/04/20   [provider]  metFORMIN (GLUCOPHAGE) 500 MG tablet Take 1 tablet (500 mg total) by mouth daily with breakfast. 03/27/20   Mar Daring, PA-C  ondansetron (ZOFRAN ODT) 4 MG disintegrating tablet Take 1 tablet (4 mg total) by mouth every 8 (eight) hours as needed for nausea or vomiting. 03/01/20   Duffy Bruce, MD  potassium chloride SA (KLOR-CON) 20 MEQ tablet Take 1 tablet (20 mEq total) by mouth daily. Please schedule an office visit before anymore refills. 03/27/21   Virginia Crews, MD  silver sulfADIAZINE (SILVADENE) 1 % cream Apply 1 application topically daily. 04/22/20   Trula Slade, DPM    Family History  Problem Relation Age of Onset   Heart disease Mother    Cirrhosis Maternal Grandfather     Alzheimer's disease Paternal Grandmother    Cirrhosis Paternal Grandfather    Heart disease Maternal Grandmother      Social History   Tobacco Use   Smoking status: Never   Smokeless tobacco: Never  Vaping Use   Vaping Use: Never used  Substance Use Topics   Alcohol use: Yes    Alcohol/week: 42.0 standard drinks of alcohol    Types: 42 Cans of beer per week    Comment: occasional   Drug use: No    Allergies as of 05/17/2022   (No Known Allergies)    Review of Systems:    All systems reviewed and negative except where noted in HPI.   Physical Exam:  Vital signs in last 24 hours: Temp:  [97.6 F (36.4 C)-98 F (36.7 C)] 97.6 F (36.4 C) (06/30 1630) Pulse Rate:  [60-88] 88 (06/30 1630) Resp:  [16-21] 16 (06/30 1630) BP: (82-121)/(53-70) 121/70 (06/30 1630) SpO2:  [91 %-98 %] 91 % (06/30 1630) Weight:  [95.3 kg] 95.3 kg (06/30 1425)   General:   Pleasant, cooperative in NAD Head:  Normocephalic and atraumatic. Eyes:   No icterus.   Conjunctiva pink. PERRLA. Ears:  Normal auditory acuity. Neck:  Supple; no masses or thyroidomegaly Lungs: Respirations even and unlabored. Lungs clear to auscultation bilaterally.   No wheezes, crackles, or rhonchi.  Heart:  Regular rate and rhythm;  Without murmur, clicks, rubs or gallops Abdomen:  Soft, nondistended, nontender. Normal bowel sounds. No appreciable masses or hepatomegaly.  No rebound or guarding.  Rectal:  Not performed. Msk:  Symmetrical without gross deformities.    Extremities:  Without edema, cyanosis or clubbing. Neurologic:  Alert and oriented x3;  grossly normal neurologically. Skin:  Intact without significant lesions or rashes. Cervical Nodes:  No significant cervical adenopathy. Psych:  Alert and cooperative. Normal affect.  LAB RESULTS: Recent Labs    05/17/22 1430  WBC 9.4  HGB 10.1*  HCT 28.7*  PLT 117*   BMET Recent Labs    05/17/22 1430  NA 129*  K 3.1*  CL 92*  CO2 23  GLUCOSE 175*  BUN  25*  CREATININE 1.57*  CALCIUM 8.1*   LFT Recent Labs    05/17/22 1430  PROT 7.0  ALBUMIN 2.9*  AST 57*  ALT 23  ALKPHOS 29*  BILITOT 1.2   PT/INR Recent Labs    05/17/22 1601  LABPROT 19.3*  INR 1.6*    STUDIES: No results found.    Impression / Plan:   Assessment: Principal Problem:   Upper GI bleed   BRAVLIO LUCA is a 51 y.o. y/o male with  who comes in with a history of excessive alcohol use and hematemesis.  The patient has been started on a PPI and octreotide.  The patient has no history of NSAID abuse therefore peptic ulcer disease is possible but less likely.  With the patient's hyponatremia and thrombocytopenia liver disease is more likely.  The patient was also noted to have an INR of 1.6 which goes along with the patient's presumed diagnosis of advanced liver disease.  Plan:  The patient will be brought urgently to the Endo department for a emergent EGD.  The patient's systolic blood pressure has been hovering around 100.  The patient and his family have been explained the plan and agree with it.  The patient has been told that he needs to stop drinking.  The patient will be followed by Dr. Vicente Males over the weekend.  Thank you for involving me in the care of this patient.      LOS: 0 days   Cody Lame, MD, [facility] 05/17/2022, 4:41 PM,  Pager [phone] 7am-5pm  Check AMION for 5pm -7am coverage and on weekends   Note: This dictation was prepared with Dragon dictation along with smaller phrase technology. Any transcriptional errors that result from this process are unintentional.

## 2022-05-17 NOTE — Transfer of Care (Signed)
Immediate Anesthesia Transfer of Care Note  Patient: Cody [name]  Procedure(s) Performed: ESOPHAGOGASTRODUODENOSCOPY (EGD)  Patient Location: Endoscopy Unit  Anesthesia Type:General  Level of Consciousness: drowsy  Airway & Oxygen Therapy: Patient Spontanous Breathing and Patient connected to face mask oxygen  Post-op Assessment: Report given to RN and Post -op Vital signs reviewed and stable  Post vital signs: Reviewed and stable  Last Vitals:  Vitals Value Taken Time  BP 105/61 05/17/22 1757  Temp 36.7 C 05/17/22 1756  Pulse 74 05/17/22 1800  Resp 17 05/17/22 1800  SpO2 100 % 05/17/22 1800  Vitals shown include unvalidated device data.  Last Pain:  Vitals:   05/17/22 1756  TempSrc: Temporal  PainSc:          Complications: No notable events documented.

## 2022-05-17 NOTE — ED Provider Triage Note (Signed)
Emergency Medicine Provider Triage Evaluation Note  Cody [name] , a 51 y.o. male  was evaluated in triage.  Pt complains of 3 episodes of vomiting dark red blood and seizure activity. No previous seizure history.  Physical Exam  There were no vitals taken for this visit. Gen:   Awake, no distress   Resp:  Normal effort  MSK:   Moves extremities without difficulty  Other:    Medical Decision Making  Medically screening exam initiated at 2:18 PM.  Appropriate orders placed.  Cody [name] was informed that the remainder of the evaluation will be completed by another provider, this initial triage assessment does not replace that evaluation, and the importance of remaining in the ED until their evaluation is complete.    Victorino Dike, FNP 05/17/22 1420

## 2022-05-17 NOTE — ED Provider Notes (Signed)
[facility] Provider Note   Event Date/Time   First MD Initiated Contact with Patient 05/17/22 1517     (approximate)   History   Hematemesis   HPI  Cody [name] is a 51 y.o. male with a history of congestive heart failure, reviewed previous cardiology notes, and ICD  Patient this morning woke up felt nauseated began vomiting large amount of blood.  He reports he had 3 episodes of vomiting large amounts of blood.  Last 1 occurring just before arrival to ED.  He felt lightheaded weak fatigued and felt very lightheaded with trying to walk or stand before coming to the ER  He does take aspirin daily.  Denies taking any other blood thinners.  No chest pain no shortness of breath.  No preceding illness until this morning when he started having vomiting.  Denies abdominal pain.  Does use alcohol drinks about 6 beers daily     Physical Exam   Triage Vital Signs: ED Triage Vitals  Enc Vitals Group     BP 05/17/22 1418 (!) 82/53     Pulse Rate 05/17/22 1418 60     Resp 05/17/22 1418 18     Temp 05/17/22 1418 98 F (36.7 C)     Temp Source 05/17/22 1418 Oral     SpO2 05/17/22 1418 97 %     Weight 05/17/22 1425 210 lb (95.3 kg)     Height 05/17/22 1425 '5\' 9"'$  (1.753 m)     Head Circumference --      Peak Flow --      Pain Score 05/17/22 1425 0     Pain Loc --      Pain Edu? --      Excl. in [place]? --     Most recent vital signs: Vitals:   05/17/22 1618 05/17/22 1630  BP: 99/62 121/70  Pulse: 79 88  Resp: 18 16  Temp:  97.6 F (36.4 C)  SpO2: 98% 91%     General: Awake, no distress.  CV:  Good peripheral perfusion.  Normal heart tones Resp:  Normal effort.  Clear Abd:  No distention.  No abdominal pain to palpation any quadrant.  No rebound or guarding.  Has a umbilical hernia that is soft and nontender.  No obvious ascites on exam but does have somewhat prominent veins around the umbilicus Other:  No lower extremity edema.  No noted evidence  of hypervolemia.  No JVD   ED Results / Procedures / Treatments   Labs (all labs ordered are listed, but only abnormal results are displayed) Labs Reviewed  COMPREHENSIVE METABOLIC PANEL - Abnormal; Notable for the following components:      Result Value   Sodium 129 (*)    Potassium 3.1 (*)    Chloride 92 (*)    Glucose, Bld 175 (*)    BUN 25 (*)    Creatinine, Ser 1.57 (*)    Calcium 8.1 (*)    Albumin 2.9 (*)    AST 57 (*)    Alkaline Phosphatase 29 (*)    GFR, Estimated 54 (*)    All other components within normal limits  CBC WITH DIFFERENTIAL/PLATELET - Abnormal; Notable for the following components:   RBC 2.97 (*)    Hemoglobin 10.1 (*)    HCT 28.7 (*)    Platelets 117 (*)    Basophils Absolute 0.2 (*)    All other components within normal limits  LACTIC ACID, PLASMA - Abnormal;  Notable for the following components:   Lactic Acid, Venous 4.3 (*)    All other components within normal limits  PROTIME-INR - Abnormal; Notable for the following components:   Prothrombin Time 19.3 (*)    INR 1.6 (*)    All other components within normal limits  TROPONIN I (HIGH SENSITIVITY) - Abnormal; Notable for the following components:   Troponin I (High Sensitivity) 48 (*)    All other components within normal limits  URINALYSIS, ROUTINE W REFLEX MICROSCOPIC  LACTIC ACID, PLASMA  HIV ANTIBODY (ROUTINE TESTING W REFLEX)  TYPE AND SCREEN  PREPARE RBC (CROSSMATCH)  ABO/RH  TROPONIN I (HIGH SENSITIVITY)     EKG  Interpreted by me at 1430 heart rate 80 QRS 150 QTc 500 atrial sensing ventricular pacing rate of 75   RADIOLOGY     PROCEDURES:  Critical Care performed: Yes, see critical care procedure note(s)  Procedures  CRITICAL CARE Performed by: Delman Kitten   Total critical care time: 35 minutes  Critical care time was exclusive of separately billable procedures and treating other patients.  Critical care was necessary to treat or prevent imminent or  life-threatening deterioration.  Critical care was time spent personally by me on the following activities: development of treatment plan with patient and/or surrogate as well as nursing, discussions with consultants, evaluation of patient's response to treatment, examination of patient, obtaining history from patient or surrogate, ordering and performing treatments and interventions, ordering and review of laboratory studies, ordering and review of radiographic studies, pulse oximetry and re-evaluation of patient's condition.    MEDICATIONS ORDERED IN ED: Medications  0.9 %  sodium chloride infusion (0 mL/hr Intravenous Hold 05/17/22 1544)  octreotide (SANDOSTATIN) 2 mcg/mL load via infusion 50 mcg (50 mcg Intravenous Bolus from Bag 05/17/22 1614)    And  octreotide (SANDOSTATIN) 500 mcg in sodium chloride 0.9 % 250 mL (2 mcg/mL) infusion (50 mcg/hr Intravenous New Bag/Given 05/17/22 1633)  potassium chloride 10 mEq in 100 mL IVPB (10 mEq Intravenous New Bag/Given 05/17/22 1600)  atorvastatin (LIPITOR) tablet 20 mg (has no administration in time range)  carvedilol (COREG) tablet 3.125 mg (has no administration in time range)  acetaminophen (TYLENOL) tablet 650 mg (has no administration in time range)    Or  acetaminophen (TYLENOL) suppository 650 mg (has no administration in time range)  ondansetron (ZOFRAN) tablet 4 mg (has no administration in time range)    Or  ondansetron (ZOFRAN) injection 4 mg (has no administration in time range)  senna-docusate (Senokot-S) tablet 1 tablet (has no administration in time range)  LORazepam (ATIVAN) tablet 1-4 mg (has no administration in time range)    Or  LORazepam (ATIVAN) injection 1-4 mg (has no administration in time range)  thiamine tablet 100 mg (has no administration in time range)    Or  thiamine (B-1) injection 100 mg (has no administration in time range)  folic acid (FOLVITE) tablet 1 mg (has no administration in time range)  multivitamin  with minerals tablet 1 tablet (has no administration in time range)  sodium chloride 0.9 % bolus 500 mL (0 mLs Intravenous Stopped 05/17/22 1559)  ondansetron (ZOFRAN) injection 4 mg (4 mg Intravenous Given 05/17/22 1433)  pantoprazole (PROTONIX) 80 mg /NS 100 mL IVPB (0 mg Intravenous Stopped 05/17/22 1622)  sodium chloride 0.9 % bolus 500 mL (500 mLs Intravenous New Bag/Given 05/17/22 1559)     IMPRESSION / MDM / ASSESSMENT AND PLAN / ED COURSE  I reviewed the triage vital signs  and the nursing notes.                              Differential diagnosis includes, but is not limited to, upper GI bleeding, peptic ulcer disease, variceal bleeding, Boerhaave's, other source of GI bleeding, cirrhosis, etc.  Based on clinical examination without associated Donnell pain peers to be painless upper GI bleeding.  Discussed with patient and his wife, patient verbally consents to receiving blood products having discussed risks and benefits and I have ordered 2 units of blood.  Patient's presentation is most consistent with acute presentation with potential threat to life or bodily function.  The patient is on the cardiac monitor to evaluate for evidence of arrhythmia and/or significant heart rate changes.     Labs notable for anemia hemoglobin of 10.1 with his previous baselines at about 15.  Lactic acidosis.  Elevated troponin but no associated chest pain suspect likely demand ischemia, mild hyponatremia hypokalemia and mild AKI with minimal transaminitis but normal bilirubin.  INR slightly elevated ----------------------------------------- 3:40 PM on 05/17/2022 ----------------------------------------- Case and care presentation discussed with Dr. [name], of gastroenterology.  He recommends at this point and given exam findings as well as review of lab findings with the patient may have cirrhosis or underlying varices though it has not been proven.  He recommends at this point start octreotide,  hold Rocephin or antibiotic, focus out resuscitation with blood product which has been ordered.  Also PPI.  GI will be in to see the patient shortly for urgent consult request  ----------------------------------------- 4:49 PM on 05/17/2022 ----------------------------------------- Admission consult placed, discussed and patient accepted for admission to hospitalist service by Dr. Tobie Poet.  Current plan is for patient to go for urgent endoscopy with Dr. Allen Norris.  FINAL CLINICAL IMPRESSION(S) / ED DIAGNOSES   Final diagnoses:  Hematemesis without nausea     Rx / DC Orders   ED Discharge Orders     None        Note:  This document was prepared using Dragon voice recognition software and may include unintentional dictation errors.   Delman Kitten, MD 05/17/22 1650

## 2022-05-17 NOTE — ED Notes (Signed)
Paper copy of blood consent signed by wife at this time.

## 2022-05-18 DIAGNOSIS — N179 Acute kidney failure, unspecified: Secondary | ICD-10-CM

## 2022-05-18 DIAGNOSIS — K922 Gastrointestinal hemorrhage, unspecified: Secondary | ICD-10-CM | POA: Diagnosis not present

## 2022-05-18 LAB — CBC
HCT: 30.2 % — ABNORMAL LOW (ref 39.0–52.0)
Hemoglobin: 11 g/dL — ABNORMAL LOW (ref 13.0–17.0)
MCH: 33.7 pg (ref 26.0–34.0)
MCHC: 36.4 g/dL — ABNORMAL HIGH (ref 30.0–36.0)
MCV: 92.6 fL (ref 80.0–100.0)
Platelets: 75 10*3/uL — ABNORMAL LOW (ref 150–400)
RBC: 3.26 MIL/uL — ABNORMAL LOW (ref 4.22–5.81)
RDW: 14.3 % (ref 11.5–15.5)
WBC: 11 10*3/uL — ABNORMAL HIGH (ref 4.0–10.5)
nRBC: 0 % (ref 0.0–0.2)

## 2022-05-18 LAB — TROPONIN I (HIGH SENSITIVITY): Troponin I (High Sensitivity): 48 ng/L — ABNORMAL HIGH (ref <18)

## 2022-05-18 LAB — URINALYSIS, ROUTINE W REFLEX MICROSCOPIC
Bilirubin Urine: NEGATIVE
Glucose, UA: NEGATIVE mg/dL
Ketones, ur: NEGATIVE mg/dL
Leukocytes,Ua: NEGATIVE
Nitrite: NEGATIVE
Protein, ur: 30 mg/dL — AB
RBC / HPF: >50 RBC/hpf — ABNORMAL HIGH (ref 0–5)
Specific Gravity, Urine: 1.01 (ref 1.005–1.030)
pH: 5 (ref 5.0–8.0)

## 2022-05-18 LAB — BASIC METABOLIC PANEL
Anion gap: 11 (ref 5–15)
BUN: 46 mg/dL — ABNORMAL HIGH (ref 6–20)
CO2: 23 mmol/L (ref 22–32)
Calcium: 8 mg/dL — ABNORMAL LOW (ref 8.9–10.3)
Chloride: 97 mmol/L — ABNORMAL LOW (ref 98–111)
Creatinine, Ser: 1.69 mg/dL — ABNORMAL HIGH (ref 0.61–1.24)
GFR, Estimated: 49 mL/min — ABNORMAL LOW (ref >60)
Glucose, Bld: 214 mg/dL — ABNORMAL HIGH (ref 70–99)
Potassium: 3.5 mmol/L (ref 3.5–5.1)
Sodium: 131 mmol/L — ABNORMAL LOW (ref 135–145)

## 2022-05-18 LAB — HEMOGLOBIN AND HEMATOCRIT, BLOOD
HCT: 36.1 % — ABNORMAL LOW (ref 39.0–52.0)
Hemoglobin: 12.7 g/dL — ABNORMAL LOW (ref 13.0–17.0)

## 2022-05-18 LAB — GLUCOSE, CAPILLARY
Glucose-Capillary: 183 mg/dL — ABNORMAL HIGH (ref 70–99)
Glucose-Capillary: 152 mg/dL — ABNORMAL HIGH (ref 70–99)
Glucose-Capillary: 220 mg/dL — ABNORMAL HIGH (ref 70–99)
Glucose-Capillary: 149 mg/dL — ABNORMAL HIGH (ref 70–99)

## 2022-05-18 LAB — MAGNESIUM: Magnesium: 1.8 mg/dL (ref 1.7–2.4)

## 2022-05-18 LAB — LACTIC ACID, PLASMA
Lactic Acid, Venous: 2.4 mmol/L (ref 0.5–1.9)
Lactic Acid, Venous: 2.6 mmol/L (ref 0.5–1.9)
Lactic Acid, Venous: 2.7 mmol/L (ref 0.5–1.9)

## 2022-05-18 LAB — HIV ANTIBODY (ROUTINE TESTING W REFLEX): HIV Screen 4th Generation wRfx: NONREACTIVE

## 2022-05-18 MED ORDER — PANTOPRAZOLE SODIUM 40 MG IV SOLR
40.0000 mg | Freq: Two times a day (BID) | INTRAVENOUS | Status: DC
  Administered 2022-05-18 – 2022-05-20 (×4): 40 mg via INTRAVENOUS
  Filled 2022-05-18 (×4): qty 10

## 2022-05-18 MED ORDER — SODIUM CHLORIDE 0.9 % IV SOLN
2.0000 g | INTRAVENOUS | Status: DC
  Administered 2022-05-18 – 2022-05-19 (×2): 2 g via INTRAVENOUS
  Filled 2022-05-18: qty 2
  Filled 2022-05-18 (×2): qty 20

## 2022-05-18 MED ORDER — LACTATED RINGERS IV SOLN: INTRAVENOUS | Status: AC

## 2022-05-18 NOTE — Progress Notes (Signed)
Patient transferred to hospital bed, all belongings are in bed with patient, patient transferred to 1C on cardiac monitoring in hospital bed in stable condition.

## 2022-05-18 NOTE — Anesthesia Postprocedure Evaluation (Signed)
Anesthesia Post Note  Patient: Cody [name]  Procedure(s) Performed: ESOPHAGOGASTRODUODENOSCOPY (EGD)  Patient location during evaluation: PACU Anesthesia Type: General Level of consciousness: awake and alert Pain management: pain level controlled Vital Signs Assessment: post-procedure vital signs reviewed and stable Respiratory status: spontaneous breathing, nonlabored ventilation, respiratory function stable and patient connected to nasal cannula oxygen Cardiovascular status: blood pressure returned to baseline and stable Postop Assessment: no apparent nausea or vomiting Anesthetic complications: no   No notable events documented.   Last Vitals:  Vitals:   05/18/22 0200 05/18/22 0300  BP: 123/74 126/74  Pulse: 79 78  Resp: 16 16  Temp: 36.6 C   SpO2: 100% 94%    Last Pain:  Vitals:   05/18/22 0200  TempSrc: Tympanic  PainSc: 0-No pain                 Brett Canales Areal [place]

## 2022-05-18 NOTE — Plan of Care (Signed)
Continuing with plan of care. 

## 2022-05-18 NOTE — Progress Notes (Signed)
Progress Note   Patient: Cody [name] DOB: [birth year] DOA: 05/17/2022     1 DOS: the patient was seen and examined on 05/18/2022   Brief hospital course: Mr. Cody [name] is a 51 year old male with nonischemic cardiomyopathy status post ICD, alcohol abuse, hypertension, obesity, hyperlipidemia, hepatic steatosis, who presents emergency department for chief concerns of hematemesis.  Initial vitals in the emergency department showed temperature of 98, respiration rate 18, heart rate 60, blood pressure initially was 82/53, SPO2 97% on room air.  Serum serum is 139, potassium 3.1, chloride 92, bicarb 23, BUN of 25, serum creatinine 1.57, GFR 54, WBC 9.4, hemoglobin 10.1, platelets 117, alk phos was elevated at 29, high sensitive troponin was 48, lactic acid was 4.3.  Of note baseline hemoglobin was 15.1 on 03/01/2020.  EDP consulted GI who will take patient to the endoscopy room today.  ED treatment octreotide gtt., ondansetron 4 mg IV one-time dose, Protonix gtt., sodium chloride 500 mL bolus x2.  7/1: Patient underwent EGD with GI yesterday.  Found to have grade 2 varices s/p banding.  Also found to have 2 oozing gastric ulcers s/p clipped Also has multiple angiodysplastic lesions s/p hemostasis was achieved with argon plasma. Lactic acid remained elevated.  Giving some IV fluid. Patient was placed on octreotide infusion-for 72 hours. Received 1 dose of IV Protonix-starting IV twice daily Protonix. No prior diagnosis of liver cirrhosis, history of alcohol abuse.  RUQ ultrasound ordered Also starting him on ceftriaxone for esophageal varices prophylaxis. Also had AKI-giving some gentle of fluid.    Assessment and Plan: * Upper GI bleed Patient underwent EGD yesterday and found to have grade 2 varices s/p banding, also found to have a oozing gastric ulcer and clips was placed.  Multiple angiodysplastic lesions s/p hemostasis obtained with argon plasma. Currently stable with no  active bleeding. No prior diagnosis of cirrhosis but patient has an history of alcohol abuse. -Right upper quadrant ultrasound -Continue with octreotide for total of 72 hours -Protonix IV twice daily -Start him on ceftriaxone for esophageal varices prophylaxis.   Acute blood loss anemia - Secondary to upper GI bleed Received 2 unit of PRBC. -Continue to monitor -Transfuse if below 8  AKI (acute kidney injury) ([place]) Secondary to GI bleed and volume depletion. Creatinine at 1.69 with baseline around 1. -Giving some gentle IV fluid -Continue to monitor -Avoid nephrotoxins  Nonischemic dilated cardiomyopathy ([place]) - resumed home digoxin 0.25 mg daily today and coreg for 05/18/22 - Holding entresto at this time due to hypotension in setting of UGI and AKI -Closely monitor volume status as giving some IV fluid for lactic acidosis and AKI  Diabetes mellitus type 2, noninsulin dependent ([facility]) - Metformin has not been resumed while inpatient - Insulin SSI with at bedtime coverage ordered   Alcohol abuse - CIWA precaution placed   Hypokalemia Resolved with repletion -Continue to monitor and replete as needed.  Hypomagnesemia Resolved. -Continue to monitor  HLD (hyperlipidemia) - Atorvastatin 40 mg daily resumed   Subjective: Patient denies any more hematemesis.  No melena either.  No abdominal pain.  Physical Exam: Vitals:   05/18/22 1200 05/18/22 1300 05/18/22 1400 05/18/22 1650  BP: 118/70 114/64 116/69 120/67  Pulse: 78 75 72 73  Resp: _0 Temp: 99 F (37.2 C)   98.3 F (36.8 C)  TempSrc: Oral     SpO2: 99% 100% 100% 100%  Weight:      Height:  General.  Well-developed gentleman, in no acute distress. Pulmonary.  Lungs clear bilaterally, normal respiratory effort. CV.  Regular rate and rhythm, no JVD, rub or murmur. Abdomen.  Soft, nontender, nondistended, BS positive. CNS.  Alert and oriented .  No focal neurologic deficit. Extremities.  No  edema, no cyanosis, pulses intact and symmetrical. Psychiatry.  Judgment and insight appears normal.  Data Reviewed: Prior data reviewed  Family Communication: Discussed with patient  Disposition: Status is: Inpatient Remains inpatient appropriate because: Severity of illness   Planned Discharge Destination: Home  Time spent: 50 minutes  This record has been created using Systems analyst. Errors have been sought and corrected,but may not always be located. Such creation errors do not reflect on the standard of care.  Author: Lorella Nimrod, MD 05/18/2022 5:07 PM  For on call review [email or web address].

## 2022-05-18 NOTE — Progress Notes (Signed)
Patient able to get to and from the [facility] without difficulty, continues on Octriatide at 34m/hr and tolerating well.  Patient to transfer to 1C and report given to receiving nurse, KMaudie Mercury

## 2022-05-18 NOTE — Progress Notes (Signed)
Cody [name] , MD [address], [place], [place], [place], [address] [address], Cody [name], [place], [place], [address] Phone: [phone]  Fax: [phone]   Cody [name] is being followed for GI bleed  Day 1 of follow up   Subjective: Doing well no hematemesis, still has dark stool.    Objective: Vital signs in last 24 hours: Vitals:   05/18/22 1200 05/18/22 1300 05/18/22 1400 05/18/22 1650  BP: 118/70 114/64 116/69 120/67  Pulse: 78 75 72 73  Resp: '13 14 12 18  '$ Temp: 99 F (37.2 C)   98.3 F (36.8 C)  TempSrc: Oral     SpO2: 99% 100% 100% 100%  Weight:      Height:       Weight change:   Intake/Output Summary (Last 24 hours) at 05/18/2022 1739 Last data filed at 05/18/2022 1410 Gross per 24 hour  Intake 1730.29 ml  Output 1950 ml  Net -219.71 ml     Exam: Heart:: Regular rate and rhythm, S1S2 present, or without murmur or extra heart sounds Lungs: normal Abdomen: soft, nontender, normal bowel sounds   Lab Results: '@LABTEST2'$ @ Micro Results: Recent Results (from the past 240 hour(s))  MRSA Next Gen by PCR, Nasal     Status: None   Collection Time: 05/17/22  7:13 PM   Specimen: Nasal Mucosa; Nasal Swab  Result Value Ref Range Status   MRSA by PCR Next Gen NOT DETECTED NOT DETECTED Final    Comment: (NOTE) The GeneXpert MRSA Assay (FDA approved for NASAL specimens only), is one component of a comprehensive MRSA colonization surveillance program. It is not intended to diagnose MRSA infection nor to guide or monitor treatment for MRSA infections. Test performance is not FDA approved in patients less than 43 years old. Performed at [facility], [address] [place], [place] [address]    Studies/Results: [facility] Chest Port 1 View  Result Date: 05/17/2022 CLINICAL DATA:  Anemia EXAM: PORTABLE CHEST 1 VIEW COMPARISON:  03/01/2020 FINDINGS: Cardiac shadow is stable. Defibrillator is again seen and stable. No focal infiltrate or sizable effusion  is seen. No bony abnormality is noted. IMPRESSION: No acute abnormality noted. Electronically Signed   By: Inez Catalina M.D.   On: 05/17/2022 20:06   Medications: I have reviewed the patient's current medications. Scheduled Meds:  atorvastatin  20 mg Oral Daily   carvedilol  3.125 mg Oral BID WC   Chlorhexidine Gluconate Cloth  6 each Topical Daily   digoxin  0.25 mg Oral Daily   folic acid  1 mg Oral Daily   insulin aspart  0-15 Units Subcutaneous TID WC   insulin aspart  0-5 Units Subcutaneous QHS   multivitamin with minerals  1 tablet Oral Daily   pantoprazole (PROTONIX) IV  40 mg Intravenous Q12H   thiamine  100 mg Oral Daily   Or   thiamine  100 mg Intravenous Daily   Continuous Infusions:  sodium chloride Stopped (05/17/22 1544)   cefTRIAXone (ROCEPHIN)  IV 2 g (05/18/22 1658)   lactated ringers     octreotide (SANDOSTATIN) 500 mcg in sodium chloride 0.9 % 250 mL (2 mcg/mL) infusion 50 mcg/hr (05/18/22 1410)   PRN Meds:.acetaminophen **OR** acetaminophen, LORazepam **OR** LORazepam, melatonin, ondansetron **OR** ondansetron (ZOFRAN) IV, mouth rinse, senna-docusate    Latest Ref Rng & Units 05/18/2022    9:33 AM 05/18/2022   12:27 AM 05/17/2022    2:30 PM  CBC  WBC 4.0 - 10.5 K/uL 11.0  9.4   Hemoglobin 13.0 - 17.0 g/dL 11.0  12.7  10.1   Hematocrit 39.0 - 52.0 % 30.2  36.1  28.7   Platelets 150 - 400 K/uL 75   117      Assessment: Principal Problem:   Upper GI bleed Active Problems:   HLD (hyperlipidemia)   Nonischemic dilated cardiomyopathy (HCC)   Diabetes mellitus type 2, noninsulin dependent (HCC)   Acute blood loss anemia   Hypokalemia   Alcohol abuse   Hypomagnesemia   AKI (acute kidney injury) ([place])  Cody [name] 51 y.o. male with a history of excess alcohol consumption presented to the hospital with hematemesis, acute liver failure, INR 1.6 underwent upper endoscopy yesterday was found to have grade 2 varices in the lower third of the esophagus that was  banded with 2 bands, blood was seen in the stomach 2 oozing cratered gastric ulcers upon the cardia which were clipped multiple AVMs were found in the gastric antrum that were treated with argon duodenum appeared normal.  This morning hemoglobin is 12.7 gCreatinine is elevated at 1.69.  No INR has been checked today  Plan: 1.  Recommend treating acute renal failure aggressively 2.  Check INR if elevated suggest vitamin K 10 mg subcutaneous once daily for 3 days.  Correct any malnutrition related vitamin K deficiency 3.  Thiamine and folate replacement prevent Wernicke's encephalopathy 4.  Stop all alcohol consumption 5.  Follow-up right upper quadrant ultrasound 6.  If has further evidence of bleeding would need to be transferred to [facility] for TIPS procedure as we cannot repeat endoscopy due to the presence of bands in the esophagus 7.  Continue octreotide for total of 72 hours 8.  Antibiotics for SBP prophylaxis in view of a GI bleed with possible underlying liver cirrhosis 9.  If there is no evidence of liver cirrhosis on ultrasound suggest to get portal vein Dopplers to rule out portal vein thrombosis 10 . Can advance diet    LOS: 1 day   Cody Bellows, MD 05/18/2022, 5:39 PM

## 2022-05-18 NOTE — Assessment & Plan Note (Signed)
Secondary to GI bleed and volume depletion. Creatinine at 1.69 with baseline around 1. -Giving some gentle IV fluid -Continue to monitor -Avoid nephrotoxins

## 2022-05-19 ENCOUNTER — Inpatient Hospital Stay: Payer: Medicare HMO

## 2022-05-19 DIAGNOSIS — K922 Gastrointestinal hemorrhage, unspecified: Secondary | ICD-10-CM | POA: Diagnosis not present

## 2022-05-19 LAB — BASIC METABOLIC PANEL
Anion gap: 9 (ref 5–15)
BUN: 42 mg/dL — ABNORMAL HIGH (ref 6–20)
CO2: 24 mmol/L (ref 22–32)
Calcium: 8.1 mg/dL — ABNORMAL LOW (ref 8.9–10.3)
Chloride: 102 mmol/L (ref 98–111)
Creatinine, Ser: 1.51 mg/dL — ABNORMAL HIGH (ref 0.61–1.24)
GFR, Estimated: 57 mL/min — ABNORMAL LOW (ref >60)
Glucose, Bld: 155 mg/dL — ABNORMAL HIGH (ref 70–99)
Potassium: 3.2 mmol/L — ABNORMAL LOW (ref 3.5–5.1)
Sodium: 135 mmol/L (ref 135–145)

## 2022-05-19 LAB — IRON AND TIBC
Iron: 167 ug/dL (ref 45–182)
Saturation Ratios: 71 % — ABNORMAL HIGH (ref 17.9–39.5)
TIBC: 235 ug/dL — ABNORMAL LOW (ref 250–450)
UIBC: 68 ug/dL

## 2022-05-19 LAB — GLUCOSE, CAPILLARY
Glucose-Capillary: 143 mg/dL — ABNORMAL HIGH (ref 70–99)
Glucose-Capillary: 145 mg/dL — ABNORMAL HIGH (ref 70–99)
Glucose-Capillary: 121 mg/dL — ABNORMAL HIGH (ref 70–99)
Glucose-Capillary: 216 mg/dL — ABNORMAL HIGH (ref 70–99)
Glucose-Capillary: 152 mg/dL — ABNORMAL HIGH (ref 70–99)

## 2022-05-19 LAB — RETICULOCYTES
Immature Retic Fract: 14.2 % (ref 2.3–15.9)
RBC.: 3.02 MIL/uL — ABNORMAL LOW (ref 4.22–5.81)
Retic Count, Absolute: 75.5 10*3/uL (ref 19.0–186.0)
Retic Ct Pct: 2.5 % (ref 0.4–3.1)

## 2022-05-19 LAB — CBC
HCT: 28.1 % — ABNORMAL LOW (ref 39.0–52.0)
Hemoglobin: 10 g/dL — ABNORMAL LOW (ref 13.0–17.0)
MCH: 33.7 pg (ref 26.0–34.0)
MCHC: 35.6 g/dL (ref 30.0–36.0)
MCV: 94.6 fL (ref 80.0–100.0)
Platelets: 70 10*3/uL — ABNORMAL LOW (ref 150–400)
RBC: 2.97 MIL/uL — ABNORMAL LOW (ref 4.22–5.81)
RDW: 14 % (ref 11.5–15.5)
WBC: 9.6 10*3/uL (ref 4.0–10.5)
nRBC: 0 % (ref 0.0–0.2)

## 2022-05-19 LAB — LACTIC ACID, PLASMA: Lactic Acid, Venous: 1.6 mmol/L (ref 0.5–1.9)

## 2022-05-19 LAB — FOLATE: Folate: 9.5 ng/mL (ref >5.9)

## 2022-05-19 LAB — FERRITIN: Ferritin: 266 ng/mL (ref 24–336)

## 2022-05-19 LAB — VITAMIN B12: Vitamin B-12: 360 pg/mL (ref 180–914)

## 2022-05-19 LAB — MAGNESIUM: Magnesium: 1.8 mg/dL (ref 1.7–2.4)

## 2022-05-19 MED ORDER — POTASSIUM CHLORIDE CRYS ER 20 MEQ PO TBCR
40.0000 meq | EXTENDED_RELEASE_TABLET | Freq: Once | ORAL | Status: AC
  Administered 2022-05-19: 40 meq via ORAL
  Filled 2022-05-19: qty 2

## 2022-05-19 NOTE — TOC Initial Note (Signed)
Transition of Care [facility]) - Initial/Assessment Note    Patient Details  Name: Cody [name] MRN: [id number] Date of Birth: 1971/03/25  [facility]) CM/SW Contact:    Magnus Ivan, LCSW Phone Number: 05/19/2022, 11:59 AM  Clinical Narrative:                 [facility] consult for SA resources. CSW spoke with patient via phone. Patient lives with his wife and drives himself to appointments. PCP is [facility], he stated his provider left the practice, CSW advised he call and be assigned to a new provider.  Pharmacy is [facility] in [place]. Patient denies issues with obtaining medications or resource needs. Patient declines SA resources at this time.   Expected Discharge Plan: Home/Self Care Barriers to Discharge: Continued Medical Work up   Patient Goals and CMS Choice Patient states their goals for this hospitalization and ongoing recovery are:: to return home CMS Medicare.gov Compare Post Acute Care list provided to:: Patient Choice offered to / list presented to : Patient  Expected Discharge Plan and Services Expected Discharge Plan: Home/Self Care       Living arrangements for the past 2 months: Single Family Home                                      Prior Living Arrangements/Services Living arrangements for the past 2 months: Single Family Home Lives with:: Spouse Patient language and need for interpreter reviewed:: Yes Do you feel safe going back to the place where you live?: Yes      Need for Family Participation in Patient Care: Yes (Comment) Care giver support system in place?: Yes (comment)   Criminal Activity/Legal Involvement Pertinent to Current Situation/Hospitalization: No - Comment as needed  Activities of Daily Living Home Assistive Devices/Equipment: None ADL Screening (condition at time of admission) Patient's cognitive ability adequate to safely complete daily activities?: Yes Is the patient deaf or have difficulty  hearing?: No Does the patient have difficulty seeing, even when wearing glasses/contacts?: No Does the patient have difficulty concentrating, remembering, or making decisions?: No Patient able to express need for assistance with ADLs?: Yes Does the patient have difficulty dressing or bathing?: No Independently performs ADLs?: Yes (appropriate for developmental age) Does the patient have difficulty walking or climbing stairs?: No Weakness of Legs: None Weakness of Arms/Hands: None  Permission Sought/Granted                  Emotional Assessment       Orientation: : Oriented to Self, Oriented to Place, Oriented to  Time, Oriented to Situation Alcohol / Substance Use: Not Applicable Psych Involvement: No (comment)  Admission diagnosis:  Upper GI bleed [K92.2] Hematemesis without nausea [K92.0] Patient Active Problem List   Diagnosis Date Noted   AKI (acute kidney injury) ([place]) 05/18/2022   Upper GI bleed 05/17/2022   Diabetes mellitus type 2, noninsulin dependent ([place]) 05/17/2022   Acute blood loss anemia 05/17/2022   Hypokalemia 05/17/2022   Alcohol abuse 05/17/2022   Hypomagnesemia 05/17/2022   Diabetic ulcer of toe of left foot associated with type 2 diabetes mellitus, limited to breakdown of skin ([place]) 03/27/2020   Pharyngitis 31/49/7026   Chronic systolic heart failure ([place]) 04/10/2017   NSVT (nonsustained ventricular tachycardia) ([place]) 04/10/2017   AICD (automatic cardioverter/defibrillator) present 04/10/2017   Well controlled diabetes mellitus ([place]) 05/16/2016   Gout 05/16/2016  HLD (hyperlipidemia) 05/16/2016   Benign hypertension 05/16/2016   Cardiomyopathy ([place]) 05/16/2016   Adiposity 05/16/2016   Cardiac pacemaker in situ 03/03/2012   Left bundle branch block 09/03/2011   Nonischemic dilated cardiomyopathy ([place]) 09/03/2011   History of echocardiogram 09/03/2011   PCP:  Mar Daring, PA-C Pharmacy:   [facility] [address], [place] [address] Phone: [phone] Fax: [phone]     Social Determinants of Health (SDOH) Interventions    Readmission Risk Interventions     No data to display

## 2022-05-19 NOTE — Plan of Care (Signed)
  Problem: Education: Goal: Knowledge of General Education information will improve Description: Including pain rating scale, medication(s)/side effects and non-pharmacologic comfort measures Outcome: Progressing   Problem: Health Behavior/Discharge Planning: Goal: Ability to manage health-related needs will improve Outcome: Progressing   Problem: Clinical Measurements: Goal: Ability to maintain clinical measurements within normal limits will improve Outcome: Progressing Goal: Will remain free from infection Outcome: Progressing Goal: Diagnostic test results will improve Outcome: Progressing Goal: Respiratory complications will improve Outcome: Progressing Goal: Cardiovascular complication will be avoided Outcome: Progressing   Problem: Activity: Goal: Risk for activity intolerance will decrease Outcome: Progressing   Problem: Nutrition: Goal: Adequate nutrition will be maintained Outcome: Progressing   Problem: Coping: Goal: Level of anxiety will decrease Outcome: Progressing   Problem: Elimination: Goal: Will not experience complications related to bowel motility Outcome: Progressing Goal: Will not experience complications related to urinary retention Outcome: Progressing   Problem: Pain Managment: Goal: General experience of comfort will improve Outcome: Progressing   Problem: Safety: Goal: Ability to remain free from injury will improve Outcome: Progressing   Problem: Skin Integrity: Goal: Risk for impaired skin integrity will decrease Outcome: Progressing   Problem: Education: Goal: Ability to describe self-care measures that may prevent or decrease complications (Diabetes Survival Skills Education) will improve Outcome: Progressing   Problem: Coping: Goal: Ability to adjust to condition or change in health will improve Outcome: Progressing   Problem: Fluid Volume: Goal: Ability to maintain a balanced intake and output will improve Outcome:  Progressing   Problem: Health Behavior/Discharge Planning: Goal: Ability to identify and utilize available resources and services will improve Outcome: Progressing Goal: Ability to manage health-related needs will improve Outcome: Progressing   Problem: Metabolic: Goal: Ability to maintain appropriate glucose levels will improve Outcome: Progressing   Problem: Nutritional: Goal: Maintenance of adequate nutrition will improve Outcome: Progressing Goal: Progress toward achieving an optimal weight will improve Outcome: Progressing   Problem: Skin Integrity: Goal: Risk for impaired skin integrity will decrease Outcome: Progressing   Problem: Tissue Perfusion: Goal: Adequacy of tissue perfusion will improve Outcome: Progressing   

## 2022-05-19 NOTE — Assessment & Plan Note (Signed)
Patient underwent EGD on 6/30 and found to have grade 2 varices s/p banding, also found to have a oozing gastric ulcer and clips was placed.  Multiple angiodysplastic lesions s/p hemostasis obtained with argon plasma. Currently stable with no active bleeding. No prior diagnosis of cirrhosis but patient has an history of alcohol abuse.  Right upper quadrant ultrasound with concern of possible liver cirrhosis. -Continue with octreotide for total of 72 hours -Protonix IV twice daily -Continue ceftriaxone for esophageal varices prophylaxis.

## 2022-05-19 NOTE — Assessment & Plan Note (Signed)
Resolved.  Continue to monitor.

## 2022-05-19 NOTE — Progress Notes (Signed)
Progress Note   Patient: Cody [name] WFU:[id number] DOB: [birth year] DOA: 05/17/2022     2 DOS: the patient was seen and examined on 05/19/2022   Brief hospital course: Mr. Haedyn Ancrum is a 51 year old male with nonischemic cardiomyopathy status post ICD, alcohol abuse, hypertension, obesity, hyperlipidemia, hepatic steatosis, who presents emergency department for chief concerns of hematemesis.  Initial vitals in the emergency department showed temperature of 98, respiration rate 18, heart rate 60, blood pressure initially was 82/53, SPO2 97% on room air.  Serum serum is 139, potassium 3.1, chloride 92, bicarb 23, BUN of 25, serum creatinine 1.57, GFR 54, WBC 9.4, hemoglobin 10.1, platelets 117, alk phos was elevated at 29, high sensitive troponin was 48, lactic acid was 4.3.  Of note baseline hemoglobin was 15.1 on 03/01/2020.  EDP consulted GI who will take patient to the endoscopy room today.  ED treatment octreotide gtt., ondansetron 4 mg IV one-time dose, Protonix gtt., sodium chloride 500 mL bolus x2.  7/1: Patient underwent EGD with GI yesterday.  Found to have grade 2 varices s/p banding.  Also found to have 2 oozing gastric ulcers s/p clipped Also has multiple angiodysplastic lesions s/p hemostasis was achieved with argon plasma. Lactic acid remained elevated.  Giving some IV fluid. Patient was placed on octreotide infusion-for 72 hours. Received 1 dose of IV Protonix-starting IV twice daily Protonix. No prior diagnosis of liver cirrhosis, history of alcohol abuse.  RUQ ultrasound ordered Also starting him on ceftriaxone for esophageal varices prophylaxis. Also had AKI-giving some gentle of fluid.  7/2: Hemoglobin at 10 today.  Leukocytosis resolved.  Platelets at 70 and no obvious bleeding. Octreotide infusion was stopped without any orders, discussed with nursing stop and we restarted octreotide to complete 72 hours as recommended by GI. Right upper quadrant ultrasound with  hepatic steatosis and possible cirrhosis. Anemia panel with anemia of chronic disease, B12 still pending.    Assessment and Plan: * Upper GI bleed Patient underwent EGD on 6/30 and found to have grade 2 varices s/p banding, also found to have a oozing gastric ulcer and clips was placed.  Multiple angiodysplastic lesions s/p hemostasis obtained with argon plasma. Currently stable with no active bleeding. No prior diagnosis of cirrhosis but patient has an history of alcohol abuse.  Right upper quadrant ultrasound with concern of possible liver cirrhosis. -Continue with octreotide for total of 72 hours -Protonix IV twice daily -Continue ceftriaxone for esophageal varices prophylaxis.   Acute blood loss anemia - Secondary to upper GI bleed Received 2 unit of PRBC. -Continue to monitor -Transfuse if below 8  AKI (acute kidney injury) ([place]) Secondary to GI bleed and volume depletion. Creatinine at 1.69 with baseline around 1. -Giving some gentle IV fluid -Continue to monitor -Avoid nephrotoxins  Nonischemic dilated cardiomyopathy ([place]) - resumed home digoxin 0.25 mg daily today and coreg for 05/18/22 - Holding entresto at this time due to hypotension in setting of UGI and AKI -Closely monitor volume status as giving some IV fluid for lactic acidosis and AKI  Diabetes mellitus type 2, noninsulin dependent (HCC) - Metformin has not been resumed while inpatient - Insulin SSI with at bedtime coverage ordered   Alcohol abuse - CIWA precaution placed   Hypokalemia Potassium at 3.2 with magnesium of 1.8 -Continue to monitor and replete as needed.  Hypomagnesemia Resolved. -Continue to monitor  HLD (hyperlipidemia) - Atorvastatin 40 mg daily resumed   Subjective: Patient was seen and examined today.  No new complaints.  No  more hematemesis.  Physical Exam: Vitals:   05/18/22 1650 05/18/22 2032 05/19/22 0800 05/19/22 1630  BP: 120/67 115/71 122/78 116/67  Pulse: 73 67 73 66   Resp: _0 Temp: 98.3 F (36.8 C) 98 F (36.7 C) 98 F (36.7 C) 98.1 F (36.7 C)  TempSrc:   Oral Oral  SpO2: 100% 100% 100% 100%  Weight:      Height:       General.  Obese gentleman, in no acute distress. Pulmonary.  Lungs clear bilaterally, normal respiratory effort. CV.  Regular rate and rhythm, no JVD, rub or murmur. Abdomen.  Soft, nontender, nondistended, BS positive. CNS.  Alert and oriented .  No focal neurologic deficit. Extremities.  No edema, no cyanosis, pulses intact and symmetrical. Psychiatry.  Judgment and insight appears normal.  Data Reviewed: Prior data reviewed  Family Communication: Discussed with wife at bedside  Disposition: Status is: Inpatient Remains inpatient appropriate because: Severity of illness   Planned Discharge Destination: Home  Time spent: 45 minutes  This record has been created using Systems analyst. Errors have been sought and corrected,but may not always be located. Such creation errors do not reflect on the standard of care.  Author: Lorella Nimrod, MD 05/19/2022 5:57 PM  For on call review [email or web address].

## 2022-05-19 NOTE — Assessment & Plan Note (Signed)
Potassium at 3.2 with magnesium of 1.8 -Continue to monitor and replete as needed.

## 2022-05-19 NOTE — Progress Notes (Signed)
Cody [name] , MD [address], [place], [place], [place], [address] [address], [place], [place], [place], [address] Phone: [phone]  Fax: [phone]   Cody [name] is being followed for upper GI bleed  Day 2 of follow up   Subjective: No complaints, no hematemesis, small light black BM earlier today , eating and drinking    Objective: Vital signs in last 24 hours: Vitals:   05/18/22 1400 05/18/22 1650 05/18/22 2032 05/19/22 0800  BP: 116/69 120/67 115/71 122/78  Pulse: 72 73 67 73  Resp: '12 18 17 18  '$ Temp:  98.3 F (36.8 C) 98 F (36.7 C) 98 F (36.7 C)  TempSrc:    Oral  SpO2: 100% 100% 100% 100%  Weight:      Height:       Weight change:   Intake/Output Summary (Last 24 hours) at 05/19/2022 1635 Last data filed at 05/19/2022 1320 Gross per 24 hour  Intake 832.7 ml  Output --  Net 832.7 ml     Exam: Heart:: Regular rate and rhythm or S1S2 present Lungs: normal Abdomen: soft, nontender, normal bowel sounds   Lab Results: '@LABTEST2'$ @ Micro Results: Recent Results (from the past 240 hour(s))  MRSA Next Gen by PCR, Nasal     Status: None   Collection Time: 05/17/22  7:13 PM   Specimen: Nasal Mucosa; Nasal Swab  Result Value Ref Range Status   MRSA by PCR Next Gen NOT DETECTED NOT DETECTED Final    Comment: (NOTE) The GeneXpert MRSA Assay (FDA approved for NASAL specimens only), is one component of a comprehensive MRSA colonization surveillance program. It is not intended to diagnose MRSA infection nor to guide or monitor treatment for MRSA infections. Test performance is not FDA approved in patients less than 74 years old. Performed at [facility], [address], [place], [place] [address]    Studies/Results: US Abdomen Limited RUQ (LIVER/GB)  Result Date: 05/19/2022 CLINICAL DATA:  Hematemesis.  Ethanol use.  Hepatic steatosis. EXAM: ULTRASOUND ABDOMEN LIMITED RIGHT UPPER QUADRANT COMPARISON:  None Available. FINDINGS:  Gallbladder: No gallstones or wall thickening visualized. No sonographic Murphy sign noted by sonographer. Common bile duct: Diameter: 4 mm, within normal limits. Liver: Diffusely increased echogenicity of the hepatic parenchyma, consistent with hepatic steatosis. Mild capsular nodularity is seen along the left hepatic lobe, raising suspicion for cirrhosis. No hepatic mass identified. Portal vein is patent on color Doppler imaging with normal direction of blood flow towards the liver. Other: None. IMPRESSION: No evidence of cholelithiasis or biliary ductal dilatation. Diffuse hepatic steatosis and possible hepatic cirrhosis. Electronically Signed   By: Marlaine Hind M.D.   On: 05/19/2022 12:11   DG Chest Port 1 View  Result Date: 05/17/2022 CLINICAL DATA:  Anemia EXAM: PORTABLE CHEST 1 VIEW COMPARISON:  03/01/2020 FINDINGS: Cardiac shadow is stable. Defibrillator is again seen and stable. No focal infiltrate or sizable effusion is seen. No bony abnormality is noted. IMPRESSION: No acute abnormality noted. Electronically Signed   By: Inez Catalina M.D.   On: 05/17/2022 20:06   Medications: I have reviewed the patient's current medications. Scheduled Meds:  atorvastatin  20 mg Oral Daily   carvedilol  3.125 mg Oral BID WC   digoxin  0.25 mg Oral Daily   folic acid  1 mg Oral Daily   insulin aspart  0-15 Units Subcutaneous TID WC   insulin aspart  0-5 Units Subcutaneous QHS   multivitamin with minerals  1 tablet Oral  Daily   pantoprazole (PROTONIX) IV  40 mg Intravenous Q12H   thiamine  100 mg Oral Daily   Or   thiamine  100 mg Intravenous Daily   Continuous Infusions:  sodium chloride Stopped (05/17/22 1544)   cefTRIAXone (ROCEPHIN)  IV Stopped (05/18/22 2015)   octreotide (SANDOSTATIN) 500 mcg in sodium chloride 0.9 % 250 mL (2 mcg/mL) infusion 50 mcg/hr (05/19/22 1038)   PRN Meds:.acetaminophen **OR** acetaminophen, LORazepam **OR** LORazepam, melatonin, ondansetron **OR** ondansetron (ZOFRAN)  IV, mouth rinse, senna-docusate   Assessment: Principal Problem:   Upper GI bleed Active Problems:   HLD (hyperlipidemia)   Nonischemic dilated cardiomyopathy (HCC)   Diabetes mellitus type 2, noninsulin dependent (HCC)   Acute blood loss anemia   Hypokalemia   Alcohol abuse   Hypomagnesemia   AKI (acute kidney injury) ([place])  Cody [name] 51 y.o. male with a history of excess alcohol consumption presented to the hospital with hematemesis, acute liver failure, INR 1.6 underwent upper endoscopy yesterday was found to have grade 2 varices in the lower third of the esophagus that was banded with 2 bands, blood was seen in the stomach 2 oozing cratered gastric ulcers upon the cardia which were clipped multiple AVMs were found in the gastric antrum that were treated with argon duodenum appeared normal.  This morning hemoglobin is 10 grams stable. No INR has been checked today   Plan: 1.  Recommend treating acute renal failure aggressively 2.  Check INR as suggested yesterday ,  if elevated suggest vitamin K 10 mg subcutaneous once daily for 3 days.  Correct any malnutrition related vitamin K deficiency 3.  Thiamine and folate replacement prevent Wernicke's encephalopathy 4.  Stop all alcohol consumption 5. Continue octreotide for total of 72 hours 6.  Antibiotics for SBP prophylaxis in view of a GI bleed with possible underlying liver cirrhosis 7 OP GI follow up with Dr Allen Norris   I will sign off.  Please call me if any further GI concerns or questions.  We would like to thank you for the opportunity to participate in the care of Cody [name].   LOS: 2 days   Cody Bellows, MD 05/19/2022, 4:35 PM

## 2022-05-20 DIAGNOSIS — K922 Gastrointestinal hemorrhage, unspecified: Secondary | ICD-10-CM | POA: Diagnosis not present

## 2022-05-20 LAB — HEPATIC FUNCTION PANEL
ALT: 40 U/L (ref 0–44)
AST: 78 U/L — ABNORMAL HIGH (ref 15–41)
Albumin: 2.7 g/dL — ABNORMAL LOW (ref 3.5–5.0)
Alkaline Phosphatase: 29 U/L — ABNORMAL LOW (ref 38–126)
Bilirubin, Direct: 0.3 mg/dL — ABNORMAL HIGH (ref 0.0–0.2)
Indirect Bilirubin: 0.8 mg/dL (ref 0.3–0.9)
Total Bilirubin: 1.1 mg/dL (ref 0.3–1.2)
Total Protein: 6.4 g/dL — ABNORMAL LOW (ref 6.5–8.1)

## 2022-05-20 LAB — BPAM RBC
Blood Product Expiration Date: 202307182359
Blood Product Expiration Date: 202307182359
Blood Product Expiration Date: 202307272359
Blood Product Expiration Date: 202307272359
ISSUE DATE / TIME: 202306301640
ISSUE DATE / TIME: 202306302018
Unit Type and Rh: 6200
Unit Type and Rh: 6200
Unit Type and Rh: 6200
Unit Type and Rh: 6200

## 2022-05-20 LAB — TYPE AND SCREEN
ABO/RH(D): A POS
Antibody Screen: NEGATIVE
Unit division: 0
Unit division: 0
Unit division: 0
Unit division: 0

## 2022-05-20 LAB — CBC
HCT: 27.2 % — ABNORMAL LOW (ref 39.0–52.0)
Hemoglobin: 9.5 g/dL — ABNORMAL LOW (ref 13.0–17.0)
MCH: 34.2 pg — ABNORMAL HIGH (ref 26.0–34.0)
MCHC: 34.9 g/dL (ref 30.0–36.0)
MCV: 97.8 fL (ref 80.0–100.0)
Platelets: 64 10*3/uL — ABNORMAL LOW (ref 150–400)
RBC: 2.78 MIL/uL — ABNORMAL LOW (ref 4.22–5.81)
RDW: 13.8 % (ref 11.5–15.5)
WBC: 5.7 10*3/uL (ref 4.0–10.5)
nRBC: 0 % (ref 0.0–0.2)

## 2022-05-20 LAB — PREPARE RBC (CROSSMATCH)

## 2022-05-20 LAB — BASIC METABOLIC PANEL
Anion gap: 7 (ref 5–15)
BUN: 37 mg/dL — ABNORMAL HIGH (ref 6–20)
CO2: 26 mmol/L (ref 22–32)
Calcium: 8 mg/dL — ABNORMAL LOW (ref 8.9–10.3)
Chloride: 107 mmol/L (ref 98–111)
Creatinine, Ser: 1.58 mg/dL — ABNORMAL HIGH (ref 0.61–1.24)
GFR, Estimated: 54 mL/min — ABNORMAL LOW (ref >60)
Glucose, Bld: 152 mg/dL — ABNORMAL HIGH (ref 70–99)
Potassium: 3.6 mmol/L (ref 3.5–5.1)
Sodium: 140 mmol/L (ref 135–145)

## 2022-05-20 LAB — PROTIME-INR
INR: 1.6 — ABNORMAL HIGH (ref 0.8–1.2)
Prothrombin Time: 19 seconds — ABNORMAL HIGH (ref 11.4–15.2)

## 2022-05-20 LAB — HEMOGLOBIN A1C
Hgb A1c MFr Bld: 5.7 % — ABNORMAL HIGH (ref 4.8–5.6)
Mean Plasma Glucose: 117 mg/dL

## 2022-05-20 LAB — GLUCOSE, CAPILLARY
Glucose-Capillary: 142 mg/dL — ABNORMAL HIGH (ref 70–99)
Glucose-Capillary: 127 mg/dL — ABNORMAL HIGH (ref 70–99)
Glucose-Capillary: 230 mg/dL — ABNORMAL HIGH (ref 70–99)

## 2022-05-20 MED ORDER — FOLIC ACID 1 MG PO TABS
1.0000 mg | ORAL_TABLET | Freq: Every day | ORAL | 1 refills | Status: DC
Start: 2022-05-20 — End: 2023-02-05

## 2022-05-20 MED ORDER — ASPIRIN 81 MG PO TABS: 81.0000 mg | ORAL_TABLET | Freq: Every day | ORAL | 0 refills | Status: AC

## 2022-05-20 MED ORDER — THIAMINE HCL 100 MG PO TABS: 100.0000 mg | ORAL_TABLET | Freq: Every day | ORAL | 1 refills | Status: DC

## 2022-05-20 MED ORDER — PANTOPRAZOLE SODIUM 40 MG PO TBEC: 40.0000 mg | DELAYED_RELEASE_TABLET | Freq: Two times a day (BID) | ORAL | 1 refills | Status: AC

## 2022-05-20 MED ORDER — ENTRESTO 24-26 MG PO TABS: 1.0000 | ORAL_TABLET | Freq: Two times a day (BID) | ORAL | 0 refills | Status: AC

## 2022-05-20 NOTE — Care Management Important Message (Signed)
Important Message  Patient Details  Name: Cody [name] MRN: [id number] Date of Birth: Dec 04, 1970   Medicare Important Message Given:  N/A - LOS <3 / Initial given by admissions     [name] 05/20/2022, 12:21 PM

## 2022-05-20 NOTE — Progress Notes (Signed)
Discharge instructions reviewed with pt. He verbalized understanding of instructions and to make his follow-up appts.

## 2022-05-20 NOTE — Discharge Summary (Signed)
Physician Discharge Summary   Patient: Cody [name] MRN: [id number] DOB: Jan 09, 1971  Admit date:     05/17/2022  Discharge date: 05/20/22  Discharge Physician: Lorella Nimrod   PCP: Mar Daring, PA-C   Recommendations at discharge:  Please obtain CBC and CMP in the next few days. Please obtain INR within a week Follow-up with primary care provider Follow-up with cardiology-his home aspirin, Entresto and Lasix is being held which can be restarted if renal function and platelet stabilizes. Follow-up with gastroenterology  Discharge Diagnoses: Principal Problem:   Upper GI bleed Active Problems:   Acute blood loss anemia   Nonischemic dilated cardiomyopathy (HCC)   AKI (acute kidney injury) ([place])   Diabetes mellitus type 2, noninsulin dependent (HCC)   Alcohol abuse   Hypokalemia   Hypomagnesemia   HLD (hyperlipidemia)   Hospital Course: Mr. Cody [name] is a 51 year old male with nonischemic cardiomyopathy status post ICD, alcohol abuse, hypertension, obesity, hyperlipidemia, hepatic steatosis, who presents emergency department for chief concerns of hematemesis.  Initial vitals in the emergency department showed temperature of 98, respiration rate 18, heart rate 60, blood pressure initially was 82/53, SPO2 97% on room air.  Serum serum is 139, potassium 3.1, chloride 92, bicarb 23, BUN of 25, serum creatinine 1.57, GFR 54, WBC 9.4, hemoglobin 10.1, platelets 117, alk phos was elevated at 29, high sensitive troponin was 48, lactic acid was 4.3.  Of note baseline hemoglobin was 15.1 on 03/01/2020.  EDP consulted GI who will take patient to the endoscopy room today.  ED treatment octreotide gtt., ondansetron 4 mg IV one-time dose, Protonix gtt., sodium chloride 500 mL bolus x2.  7/1: Patient underwent EGD with GI yesterday.  Found to have grade 2 varices s/p banding.  Also found to have 2 oozing gastric ulcers s/p clipped Also has multiple angiodysplastic lesions s/p  hemostasis was achieved with argon plasma. Lactic acid remained elevated.  Giving some IV fluid. Patient was placed on octreotide infusion-for 72 hours. Received 1 dose of IV Protonix-starting IV twice daily Protonix. No prior diagnosis of liver cirrhosis, history of alcohol abuse.  RUQ ultrasound ordered Also starting him on ceftriaxone for esophageal varices prophylaxis. Also had AKI-giving some gentle of fluid.  7/2: Hemoglobin at 10 today.  Leukocytosis resolved.  Platelets at 70 and no obvious bleeding. Octreotide infusion was stopped without any orders, discussed with nursing stop and we restarted octreotide to complete 72 hours as recommended by GI. Right upper quadrant ultrasound with hepatic steatosis and possible cirrhosis. Anemia panel with anemia of chronic disease, B12 still pending.  7/3: Patient remained stable.  No more bleeding.  Hemoglobin slowly decreasing, at 9.5 today.  Platelets at 64.  We held his home aspirin which can be resumed by his cardiologist or primary care provider once platelets started improving. Renal function stable but remained above his baseline which was done a year ago.  We held his home dose of Lasix and Entresto.  Needed close follow-up with cardiology and if renal functions remained stable they can restart.  He was also instructed to weigh himself daily and if sees any increase in weight should start taking Lasix and follow-up with his cardiologist. Anemia panel consistent with anemia of chronic disease.  B12 within lower normal limit at 360. INR remains stable at 1.4, albumin at 2.7.  Patient will need close monitoring of his liver function. Patient was counseled against alcohol use.  He will need continuation of counseling which can be done by his PCP.  Patient also need to have follow-up with gastroenterology.  Patient will continue with current medications and need to have a close follow-up with his providers.  Assessment and Plan: * Upper GI  bleed Patient underwent EGD on 6/30 and found to have grade 2 varices s/p banding, also found to have a oozing gastric ulcer and clips was placed.  Multiple angiodysplastic lesions s/p hemostasis obtained with argon plasma. Currently stable with no active bleeding. No prior diagnosis of cirrhosis but patient has an history of alcohol abuse.  Right upper quadrant ultrasound with concern of possible liver cirrhosis. -Continue with octreotide for total of 72 hours -Protonix IV twice daily -Continue ceftriaxone for esophageal varices prophylaxis.   Acute blood loss anemia - Secondary to upper GI bleed Received 2 unit of PRBC. -Continue to monitor -Transfuse if below 8  AKI (acute kidney injury) ([place]) Secondary to GI bleed and volume depletion. Creatinine at 1.69 with baseline around 1. -Giving some gentle IV fluid -Continue to monitor -Avoid nephrotoxins  Nonischemic dilated cardiomyopathy ([place]) - resumed home digoxin 0.25 mg daily today and coreg for 05/18/22 - Holding entresto at this time due to hypotension in setting of UGI and AKI -Closely monitor volume status as giving some IV fluid for lactic acidosis and AKI  Diabetes mellitus type 2, noninsulin dependent (HCC) - Metformin has not been resumed while inpatient - Insulin SSI with at bedtime coverage ordered   Alcohol abuse - CIWA precaution placed   Hypokalemia Potassium at 3.2 with magnesium of 1.8 -Continue to monitor and replete as needed.  Hypomagnesemia Resolved. -Continue to monitor  HLD (hyperlipidemia) - Atorvastatin 40 mg daily resumed   Consultants: Gastroenterology Procedures performed: EGD Disposition: Home Diet recommendation:  Discharge Diet Orders (From admission, onward)     Start     Ordered   05/20/22 0000  Diet - low sodium heart healthy        05/20/22 1238           Cardiac and Carb modified diet DISCHARGE MEDICATION: Allergies as of 05/20/2022   No Known Allergies       Medication List     STOP taking these medications    metFORMIN 1000 MG tablet Commonly known as: GLUCOPHAGE   metFORMIN 500 MG tablet Commonly known as: GLUCOPHAGE   ondansetron 4 MG disintegrating tablet Commonly known as: Zofran ODT   silver sulfADIAZINE 1 % cream Commonly known as: Silvadene       TAKE these medications    allopurinol 100 MG tablet Commonly known as: ZYLOPRIM Take 300 mg by mouth daily.   aspirin 81 MG tablet Take 1 tablet (81 mg total) by mouth daily. Please hold until you see your physician What changed: additional instructions   atorvastatin 20 MG tablet Commonly known as: LIPITOR Take 1 tablet (20 mg total) by mouth daily.   blood glucose meter kit and supplies Kit Dispense based on patient and insurance preference. Use up to four times daily as directed. (FOR ICD-9 250.00, 250.01).   carvedilol 3.125 MG tablet Commonly known as: COREG Take 3.125 mg by mouth 2 (two) times daily with a meal.   digoxin 0.25 MG tablet Commonly known as: LANOXIN Take 0.25 mg by mouth daily.   Entresto 24-26 MG Generic drug: sacubitril-valsartan Take 1 tablet by mouth 2 (two) times daily. Please hold until you see your cardiologist What changed: additional instructions   folic acid 1 MG tablet Commonly known as: FOLVITE Take 1 tablet (1 mg total) by mouth  daily.   furosemide 20 MG tablet Commonly known as: LASIX Take 40 mg by mouth daily.   pantoprazole 40 MG tablet Commonly known as: Protonix Take 1 tablet (40 mg total) by mouth 2 (two) times daily.   potassium chloride SA 20 MEQ tablet Commonly known as: KLOR-CON M Take 1 tablet (20 mEq total) by mouth daily. Please schedule an office visit before anymore refills.   thiamine 100 MG tablet Take 1 tablet (100 mg total) by mouth daily.        Follow-up Information     Mar Daring, PA-C. Schedule an appointment as soon as possible for a visit in 1 week(s).   Specialty: Family  Medicine Contact information: [address] [place] [address] [phone]         [name], MD. Schedule an appointment as soon as possible for a visit in 1 week(s).   Specialty: Gastroenterology Contact information: [place] [address] [phone]         Yolonda Kida, MD. Schedule an appointment as soon as possible for a visit in 1 week(s).   Specialties: Cardiology, Internal Medicine Contact information: [place] [address] [phone]                Discharge Exam: Danley Danker Weights   05/17/22 1425 05/17/22 1905  Weight: 95.3 kg 94.9 kg   General.     In no acute distress. Pulmonary.  Lungs clear bilaterally, normal respiratory effort. CV.  Regular rate and rhythm, no JVD, rub or murmur. Abdomen.  Soft, nontender, nondistended, BS positive. CNS.  Alert and oriented .  No focal neurologic deficit. Extremities.  No edema, no cyanosis, pulses intact and symmetrical. Psychiatry.  Judgment and insight appears normal.   Condition at discharge: stable  The results of significant diagnostics from this hospitalization (including imaging, microbiology, ancillary and laboratory) are listed below for reference.   Imaging Studies: US Abdomen Limited RUQ (LIVER/GB)  Result Date: 05/19/2022 CLINICAL DATA:  Hematemesis.  Ethanol use.  Hepatic steatosis. EXAM: ULTRASOUND ABDOMEN LIMITED RIGHT UPPER QUADRANT COMPARISON:  None Available. FINDINGS: Gallbladder: No gallstones or wall thickening visualized. No sonographic Murphy sign noted by sonographer. Common bile duct: Diameter: 4 mm, within normal limits. Liver: Diffusely increased echogenicity of the hepatic parenchyma, consistent with hepatic steatosis. Mild capsular nodularity is seen along the left hepatic lobe, raising suspicion for cirrhosis. No hepatic mass identified. Portal vein is patent on color Doppler imaging with normal direction of blood flow  towards the liver. Other: None. IMPRESSION: No evidence of cholelithiasis or biliary ductal dilatation. Diffuse hepatic steatosis and possible hepatic cirrhosis. Electronically Signed   By: Marlaine Hind M.D.   On: 05/19/2022 12:11   DG Chest Port 1 View  Result Date: 05/17/2022 CLINICAL DATA:  Anemia EXAM: PORTABLE CHEST 1 VIEW COMPARISON:  03/01/2020 FINDINGS: Cardiac shadow is stable. Defibrillator is again seen and stable. No focal infiltrate or sizable effusion is seen. No bony abnormality is noted. IMPRESSION: No acute abnormality noted. Electronically Signed   By: Inez Catalina M.D.   On: 05/17/2022 20:06    Microbiology: Results for orders placed or performed during the hospital encounter of 05/17/22  MRSA Next Gen by PCR, Nasal     Status: None   Collection Time: 05/17/22  7:13 PM   Specimen: Nasal Mucosa; Nasal Swab  Result Value Ref Range Status   MRSA by PCR Next Gen NOT DETECTED NOT DETECTED Final  Comment: (NOTE) The GeneXpert MRSA Assay (FDA approved for NASAL specimens only), is one component of a comprehensive MRSA colonization surveillance program. It is not intended to diagnose MRSA infection nor to guide or monitor treatment for MRSA infections. Test performance is not FDA approved in patients less than 41 years old. Performed at [facility], [address] [place], [place] [address]     Labs: CBC: Recent Labs  Lab 05/17/22 1430 05/18/22 0027 05/18/22 0933 05/19/22 0501 05/20/22 0356  WBC 9.4  --  11.0* 9.6 5.7  NEUTROABS 5.8  --   --   --   --   HGB 10.1* 12.7* 11.0* 10.0* 9.5*  HCT 28.7* 36.1* 30.2* 28.1* 27.2*  MCV 96.6  --  92.6 94.6 97.8  PLT 117*  --  75* 70* 64*   Basic Metabolic Panel: Recent Labs  Lab 05/17/22 1430 05/18/22 0531 05/19/22 0501 05/19/22 0855 05/20/22 0356  NA 129* 131* 135  --  140  K 3.1* 3.5 3.2*  --  3.6  CL 92* 97* 102  --  107  CO2 '23 23 24  ' --  26  GLUCOSE 175* 214* 155*  --  152*  BUN 25* 46* 42*  --   37*  CREATININE 1.57* 1.69* 1.51*  --  1.58*  CALCIUM 8.1* 8.0* 8.1*  --  8.0*  MG 1.6* 1.8  --  1.8  --    Liver Function Tests: Recent Labs  Lab 05/17/22 1430 05/20/22 0837  AST 57* 78*  ALT 23 40  ALKPHOS 29* 29*  BILITOT 1.2 1.1  PROT 7.0 6.4*  ALBUMIN 2.9* 2.7*   CBG: Recent Labs  Lab 05/19/22 1225 05/19/22 1655 05/19/22 2054 05/20/22 0739 05/20/22 1211  GLUCAP 121* 216* 152* 127* 230*    Discharge time spent: greater than 30 minutes.  This record has been created using Systems analyst. Errors have been sought and corrected,but may not always be located. Such creation errors do not reflect on the standard of care.   Signed: Lorella Nimrod, MD [facility] 05/20/2022

## 2022-05-20 NOTE — Plan of Care (Signed)

## 2022-05-23 ENCOUNTER — Encounter: Payer: Self-pay | Admitting: Gastroenterology

## 2022-05-29 ENCOUNTER — Inpatient Hospital Stay: Payer: Medicare HMO | Admitting: Physician Assistant

## 2022-05-29 DIAGNOSIS — Z950 Presence of cardiac pacemaker: Secondary | ICD-10-CM | POA: Diagnosis not present

## 2022-05-29 DIAGNOSIS — E119 Type 2 diabetes mellitus without complications: Secondary | ICD-10-CM | POA: Diagnosis not present

## 2022-05-29 DIAGNOSIS — I77810 Thoracic aortic ectasia: Secondary | ICD-10-CM | POA: Diagnosis not present

## 2022-05-29 DIAGNOSIS — I5032 Chronic diastolic (congestive) heart failure: Secondary | ICD-10-CM | POA: Diagnosis not present

## 2022-05-29 DIAGNOSIS — I428 Other cardiomyopathies: Secondary | ICD-10-CM | POA: Diagnosis not present

## 2022-05-29 DIAGNOSIS — E669 Obesity, unspecified: Secondary | ICD-10-CM | POA: Diagnosis not present

## 2022-05-29 DIAGNOSIS — E7800 Pure hypercholesterolemia, unspecified: Secondary | ICD-10-CM | POA: Diagnosis not present

## 2022-05-29 DIAGNOSIS — Z8719 Personal history of other diseases of the digestive system: Secondary | ICD-10-CM | POA: Diagnosis not present

## 2022-05-29 DIAGNOSIS — I959 Hypotension, unspecified: Secondary | ICD-10-CM | POA: Diagnosis not present

## 2022-05-29 DIAGNOSIS — I447 Left bundle-branch block, unspecified: Secondary | ICD-10-CM | POA: Diagnosis not present

## 2022-05-29 DIAGNOSIS — G4733 Obstructive sleep apnea (adult) (pediatric): Secondary | ICD-10-CM | POA: Diagnosis not present

## 2022-06-14 ENCOUNTER — Telehealth (INDEPENDENT_AMBULATORY_CARE_PROVIDER_SITE_OTHER): Payer: Self-pay

## 2022-06-14 NOTE — Telephone Encounter (Signed)
Called pt to offer an appt on Monday 7.31.23 due to a cancellation and his urgent referral. Patient's wife states that she has another appt for that day/time. Advised that they will keep the original appt.

## 2022-06-20 ENCOUNTER — Ambulatory Visit: Payer: Medicare HMO | Admitting: Gastroenterology

## 2022-06-27 ENCOUNTER — Encounter (INDEPENDENT_AMBULATORY_CARE_PROVIDER_SITE_OTHER): Payer: Medicare HMO | Admitting: Vascular Surgery

## 2022-06-27 DIAGNOSIS — I7121 Aneurysm of the ascending aorta, without rupture: Secondary | ICD-10-CM | POA: Insufficient documentation

## 2022-06-27 NOTE — Progress Notes (Deleted)
MRN : [id number]  Cody [name] is a 51 y.o. (11-Dec-1970) male who presents with chief complaint of check circulation.  History of Present Illness:   The patient presents to the office for evaluation of an ascending thoracic aortic aneurysm. The aneurysm was found incidentally by echo. Patient denies  chest pain or unusual back pain, no other chest complaints.  No history of an abrupt onset of a painful toe associated with blue discoloration.     No family history of AAA.   Patient denies amaurosis fugax or TIA symptoms. There is no history of claudication or rest pain symptoms of the lower extremities.  The patient denies angina or shortness of breath.  Cardiac echo shows an AAA that measures 4.90 cm   No outpatient medications have been marked as taking for the 06/27/22 encounter (Appointment) with Delana Meyer, Dolores Lory, MD.    Past Medical History:  Diagnosis Date   AICD (automatic cardioverter/defibrillator) present    Cardiomyopathy, nonischemic ([place])    GI bleed    Hyperlipemia    Hypertension    Presence of permanent cardiac pacemaker    Umbilical hernia     Past Surgical History:  Procedure Laterality Date   CARDIAC SURGERY     pacemaker placed in 2010, in 2014 batteries were replased   ESOPHAGOGASTRODUODENOSCOPY N/A 05/17/2022   Procedure: ESOPHAGOGASTRODUODENOSCOPY (EGD);  Surgeon: [name], MD;  Location: [facility] ENDOSCOPY;  Service: Endoscopy;  Laterality: N/A;   INSERT / REPLACE / REMOVE PACEMAKER      Social History Social History   Tobacco Use   Smoking status: Never   Smokeless tobacco: Never  Vaping Use   Vaping Use: Never used  Substance Use Topics   Alcohol use: Yes    Alcohol/week: 42.0 standard drinks of alcohol    Types: 42 Cans of beer per week    Comment: last night   Drug use: No    Family History Family History  Problem Relation Age of Onset   Heart disease Mother    Cirrhosis Maternal Grandfather    Alzheimer's  disease Paternal Grandmother    Cirrhosis Paternal Grandfather    Heart disease Maternal Grandmother     No Known Allergies   REVIEW OF SYSTEMS (Negative unless checked)  Constitutional: '[]'$ Weight loss  '[]'$ Fever  '[]'$ Chills Cardiac: '[]'$ Chest pain   '[]'$ Chest pressure   '[]'$ Palpitations   '[]'$ Shortness of breath when laying flat   '[]'$ Shortness of breath with exertion. Vascular:  '[x]'$ Pain in legs with walking   '[]'$ Pain in legs at rest  '[]'$ History of DVT   '[]'$ Phlebitis   '[]'$ Swelling in legs   '[]'$ Varicose veins   '[]'$ Non-healing ulcers Pulmonary:   '[]'$ Uses home oxygen   '[]'$ Productive cough   '[]'$ Hemoptysis   '[]'$ Wheeze  '[]'$ COPD   '[]'$ Asthma Neurologic:  '[]'$ Dizziness   '[]'$ Seizures   '[]'$ History of stroke   '[]'$ History of TIA  '[]'$ Aphasia   '[]'$ Vissual changes   '[]'$ Weakness or numbness in arm   '[]'$ Weakness or numbness in leg Musculoskeletal:   '[]'$ Joint swelling   '[]'$ Joint pain   '[]'$ Low back pain Hematologic:  '[]'$ Easy bruising  '[]'$ Easy bleeding   '[]'$ Hypercoagulable state   '[]'$ Anemic Gastrointestinal:  '[]'$ Diarrhea   '[]'$ Vomiting  '[]'$ Gastroesophageal reflux/heartburn   '[]'$ Difficulty swallowing. Genitourinary:  '[]'$ Chronic kidney disease   '[]'$ Difficult urination  '[]'$ Frequent urination   '[]'$ Blood in urine Skin:  '[]'$ Rashes   '[]'$ Ulcers  Psychological:  '[]'$ History of anxiety   '[]'$  History  of major depression.  Physical Examination  There were no vitals filed for this visit. There is no height or weight on file to calculate BMI. Gen: WD/WN, NAD Head: [place]/AT, No temporalis wasting.  Ear/Nose/Throat: Hearing grossly intact, nares w/o erythema or drainage Eyes: PER, EOMI, sclera nonicteric.  Neck: Supple, no masses.  No bruit or JVD.  Pulmonary:  Good air movement, no audible wheezing, no use of accessory muscles.  Cardiac: RRR, normal S1, S2, no Murmurs. Vascular:  mild trophic changes, no open wounds Vessel Right Left  Radial Palpable Palpable  PT Not Palpable Not Palpable  DP Not Palpable Not Palpable  Gastrointestinal: soft, non-distended. No guarding/no  peritoneal signs.  Musculoskeletal: M/S 5/5 throughout.  No visible deformity.  Neurologic: CN 2-12 intact. Pain and light touch intact in extremities.  Symmetrical.  Speech is fluent. Motor exam as listed above. Psychiatric: Judgment intact, Mood & affect appropriate for pt's clinical situation. Dermatologic: No rashes or ulcers noted.  No changes consistent with cellulitis.   CBC Lab Results  Component Value Date   WBC 5.7 05/20/2022   HGB 9.5 (L) 05/20/2022   HCT 27.2 (L) 05/20/2022   MCV 97.8 05/20/2022   PLT 64 (L) 05/20/2022    BMET    Component Value Date/Time   NA 140 05/20/2022 0356   NA 136 09/27/2019 1504   K 3.6 05/20/2022 0356   CL 107 05/20/2022 0356   CO2 26 05/20/2022 0356   GLUCOSE 152 (H) 05/20/2022 0356   BUN 37 (H) 05/20/2022 0356   BUN 8 09/27/2019 1504   CREATININE 1.58 (H) 05/20/2022 0356   CALCIUM 8.0 (L) 05/20/2022 0356   GFRNONAA 54 (L) 05/20/2022 0356   GFRAA >60 03/01/2020 1122   CrCl cannot be calculated (Patient's most recent lab result is older than the maximum 21 days allowed.).  COAG Lab Results  Component Value Date   INR 1.6 (H) 05/20/2022   INR 1.6 (H) 05/17/2022    Radiology No results found.   Assessment/Plan There are no diagnoses linked to this encounter.   Hortencia Pilar, MD  06/27/2022 9:39 AM

## 2022-07-02 DIAGNOSIS — I77810 Thoracic aortic ectasia: Secondary | ICD-10-CM | POA: Diagnosis not present

## 2022-07-02 DIAGNOSIS — I5032 Chronic diastolic (congestive) heart failure: Secondary | ICD-10-CM | POA: Diagnosis not present

## 2022-07-02 DIAGNOSIS — Z950 Presence of cardiac pacemaker: Secondary | ICD-10-CM | POA: Diagnosis not present

## 2022-07-02 DIAGNOSIS — E669 Obesity, unspecified: Secondary | ICD-10-CM | POA: Diagnosis not present

## 2022-07-02 DIAGNOSIS — I428 Other cardiomyopathies: Secondary | ICD-10-CM | POA: Diagnosis not present

## 2022-07-02 DIAGNOSIS — Z8719 Personal history of other diseases of the digestive system: Secondary | ICD-10-CM | POA: Diagnosis not present

## 2022-07-02 DIAGNOSIS — I959 Hypotension, unspecified: Secondary | ICD-10-CM | POA: Diagnosis not present

## 2022-07-02 DIAGNOSIS — G4733 Obstructive sleep apnea (adult) (pediatric): Secondary | ICD-10-CM | POA: Diagnosis not present

## 2022-07-02 DIAGNOSIS — E7800 Pure hypercholesterolemia, unspecified: Secondary | ICD-10-CM | POA: Diagnosis not present

## 2022-07-02 DIAGNOSIS — I447 Left bundle-branch block, unspecified: Secondary | ICD-10-CM | POA: Diagnosis not present

## 2022-07-02 DIAGNOSIS — E119 Type 2 diabetes mellitus without complications: Secondary | ICD-10-CM | POA: Diagnosis not present

## 2022-07-05 DIAGNOSIS — H5213 Myopia, bilateral: Secondary | ICD-10-CM | POA: Diagnosis not present

## 2022-08-05 ENCOUNTER — Ambulatory Visit (INDEPENDENT_AMBULATORY_CARE_PROVIDER_SITE_OTHER): Payer: Medicare HMO | Admitting: Vascular Surgery

## 2022-08-05 ENCOUNTER — Encounter (INDEPENDENT_AMBULATORY_CARE_PROVIDER_SITE_OTHER): Payer: Self-pay | Admitting: Vascular Surgery

## 2022-08-05 VITALS — BP 130/79 | HR 72 | Resp 16 | Ht 69.0 in | Wt 195.4 lb

## 2022-08-05 DIAGNOSIS — I429 Cardiomyopathy, unspecified: Secondary | ICD-10-CM

## 2022-08-05 DIAGNOSIS — I7121 Aneurysm of the ascending aorta, without rupture: Secondary | ICD-10-CM | POA: Diagnosis not present

## 2022-08-05 DIAGNOSIS — E119 Type 2 diabetes mellitus without complications: Secondary | ICD-10-CM | POA: Diagnosis not present

## 2022-08-05 DIAGNOSIS — E782 Mixed hyperlipidemia: Secondary | ICD-10-CM | POA: Diagnosis not present

## 2022-08-05 DIAGNOSIS — I10 Essential (primary) hypertension: Secondary | ICD-10-CM

## 2022-08-07 ENCOUNTER — Ambulatory Visit (INDEPENDENT_AMBULATORY_CARE_PROVIDER_SITE_OTHER): Payer: Medicare HMO | Admitting: Gastroenterology

## 2022-08-07 ENCOUNTER — Encounter (INDEPENDENT_AMBULATORY_CARE_PROVIDER_SITE_OTHER): Payer: Self-pay | Admitting: Vascular Surgery

## 2022-08-07 VITALS — BP 148/81 | HR 71 | Temp 98.3°F | Ht 69.0 in | Wt 195.0 lb

## 2022-08-07 DIAGNOSIS — Z1211 Encounter for screening for malignant neoplasm of colon: Secondary | ICD-10-CM | POA: Diagnosis not present

## 2022-08-07 DIAGNOSIS — D696 Thrombocytopenia, unspecified: Secondary | ICD-10-CM

## 2022-08-07 DIAGNOSIS — R748 Abnormal levels of other serum enzymes: Secondary | ICD-10-CM

## 2022-08-07 MED ORDER — NA SULFATE-K SULFATE-MG SULF 17.5-3.13-1.6 GM/177ML PO SOLN: 1.0000 | Freq: Once | ORAL | 0 refills | Status: AC

## 2022-08-07 NOTE — Progress Notes (Signed)
Primary Care Physician: Mar Daring, PA-C  Primary Gastroenterologist:  Dr. [name]  Chief Complaint  Patient presents with   Hospitalization Follow-up    HPI: Cody [name] is a 51 y.o. male here for follow-up with a history of being in the hospital with hematemesis.  The patient underwent an EGD by me and was found to have grade 2 esophageal varices that were banded.  The patient also was found to have a gastric ulcer.  The patient was found to have elevated liver enzymes consistent with alcoholic hepatitis in addition to an elevated INR and thrombocytopenia all consistent with alcoholic cirrhosis. The patient reports that he has cut down his drinking but has not stopped.  He reports that his drinking is now about a 12 pack a week.  He has had no further signs of any GI bleeding.  He also denies any black stools or abdominal pain or abdominal swelling.  There is no report of any further hematemesis.  Past Medical History:  Diagnosis Date   AICD (automatic cardioverter/defibrillator) present    Cardiomyopathy, nonischemic (HCC)    GI bleed    Hyperlipemia    Hypertension    Presence of permanent cardiac pacemaker    Umbilical hernia     Current Outpatient Medications  Medication Sig Dispense Refill   allopurinol (ZYLOPRIM) 100 MG tablet Take 300 mg by mouth daily.      aspirin 81 MG tablet Take 1 tablet (81 mg total) by mouth daily. Please hold until you see your physician 30 tablet 0   atorvastatin (LIPITOR) 20 MG tablet Take 1 tablet (20 mg total) by mouth daily. 90 tablet 3   blood glucose meter kit and supplies KIT Dispense based on patient and insurance preference. Use up to four times daily as directed. (FOR ICD-9 250.00, 250.01). 1 each 0   carvedilol (COREG) 3.125 MG tablet Take 3.125 mg by mouth 2 (two) times daily with a meal.      digoxin (LANOXIN) 0.25 MG tablet Take 0.25 mg by mouth daily.     ENTRESTO 24-26 MG Take 1 tablet by mouth 2 (two) times  daily. Please hold until you see your cardiologist 60 tablet 0   folic acid (FOLVITE) 1 MG tablet Take 1 tablet (1 mg total) by mouth daily. 90 tablet 1   furosemide (LASIX) 20 MG tablet Take 40 mg by mouth daily.      pantoprazole (PROTONIX) 40 MG tablet Take 1 tablet (40 mg total) by mouth 2 (two) times daily. 60 tablet 1   potassium chloride SA (KLOR-CON) 20 MEQ tablet Take 1 tablet (20 mEq total) by mouth daily. Please schedule an office visit before anymore refills. 30 tablet 0   thiamine 100 MG tablet Take 1 tablet (100 mg total) by mouth daily. 90 tablet 1   No current facility-administered medications for this visit.    Allergies as of 08/07/2022   (No Known Allergies)    ROS:  General: Negative for anorexia, weight loss, fever, chills, fatigue, weakness. ENT: Negative for hoarseness, difficulty swallowing , nasal congestion. CV: Negative for chest pain, angina, palpitations, dyspnea on exertion, peripheral edema.  Respiratory: Negative for dyspnea at rest, dyspnea on exertion, cough, sputum, wheezing.  GI: See history of present illness. GU:  Negative for dysuria, hematuria, urinary incontinence, urinary frequency, nocturnal urination.  Endo: Negative for unusual weight change.    Physical Examination:   BP (!) 148/81   Pulse 71   Temp 98.3  F (36.8 C) (Oral)   Ht '5\' 9"'  (1.753 m)   Wt 195 lb (88.5 kg)   BMI 28.80 kg/m   General: Well-nourished, well-developed in no acute distress.  Eyes: No icterus. Conjunctivae pink. Neuro: Alert and oriented x 3.  Grossly intact. Skin: Warm and dry, no jaundice.   Psych: Alert and cooperative, normal mood and affect.  Labs:    Imaging Studies: No results found.  Assessment and Plan:   Cody [name] is a 51 y.o. y/o male who has cirrhosis with from cytopenia elevated INR and a GI bleed with varices seen on his recent EGD during his hospital admission for hematemesis. The patient will have his labs sent off again to see how  his liver has improved since he has cut down on alcohol consumption.  The patient has been told that his liver is not is healthy as it used to be and that cirrhosis would inhibit his body From metabolizing the alcohol thereby making any alcohol intake dangerous to him.  The patient also has not had a screening colonoscopy and will be set up for screening colonoscopy.  The patient has been explained the plan and agrees with it.The patient has been explained the plan and agrees with it.     [name], MD. Marval Regal    Note: This dictation was prepared with Dragon dictation along with smaller phrase technology. Any transcriptional errors that result from this process are unintentional.

## 2022-08-07 NOTE — Progress Notes (Signed)
MRN : [id number]  Cody [name] is a 51 y.o. ([birth year]) male who presents with chief complaint of check circulation.  History of Present Illness:   The patient presents to the office for evaluation of an ascending thoracic aortic aneurysm. The aneurysm was found incidentally by transthoracic echo dated 05/01/2022. Patient denies chest pain or unusual back pain, no other chest or abdominal complaints.  No history of an abrupt onset of a painful toe associated with blue discoloration.     No family history of TAA/AAA.   Patient denies amaurosis fugax or TIA symptoms.  There is no history of claudication or rest pain symptoms of the lower extremities.   The patient denies angina or shortness of breath.  Transthoracic echo dated 05/01/2022, shows an ascending TAA that measures 4.9 cm    Current Meds  Medication Sig   allopurinol (ZYLOPRIM) 100 MG tablet Take 300 mg by mouth daily.    aspirin 81 MG tablet Take 1 tablet (81 mg total) by mouth daily. Please hold until you see your physician   carvedilol (COREG) 3.125 MG tablet Take 3.125 mg by mouth 2 (two) times daily with a meal.    digoxin (LANOXIN) 0.25 MG tablet Take 0.25 mg by mouth daily.   ENTRESTO 24-26 MG Take 1 tablet by mouth 2 (two) times daily. Please hold until you see your cardiologist   folic acid (FOLVITE) 1 MG tablet Take 1 tablet (1 mg total) by mouth daily.   furosemide (LASIX) 20 MG tablet Take 40 mg by mouth daily.    pantoprazole (PROTONIX) 40 MG tablet Take 1 tablet (40 mg total) by mouth 2 (two) times daily.   thiamine 100 MG tablet Take 1 tablet (100 mg total) by mouth daily.    Past Medical History:  Diagnosis Date   AICD (automatic cardioverter/defibrillator) present    Cardiomyopathy, nonischemic ([place])    GI bleed    Hyperlipemia    Hypertension    Presence of permanent cardiac pacemaker    Umbilical hernia     Past Surgical History:  Procedure Laterality Date   CARDIAC SURGERY      pacemaker placed in 2010, in 2014 batteries were replased   ESOPHAGOGASTRODUODENOSCOPY N/A 05/17/2022   Procedure: ESOPHAGOGASTRODUODENOSCOPY (EGD);  Surgeon: [name], MD;  Location: [facility] ENDOSCOPY;  Service: Endoscopy;  Laterality: N/A;   INSERT / REPLACE / REMOVE PACEMAKER      Social History Social History   Tobacco Use   Smoking status: Never   Smokeless tobacco: Never  Vaping Use   Vaping Use: Never used  Substance Use Topics   Alcohol use: Yes    Alcohol/week: 42.0 standard drinks of alcohol    Types: 42 Cans of beer per week    Comment: last night   Drug use: No    Family History Family History  Problem Relation Age of Onset   Heart disease Mother    Cirrhosis Maternal Grandfather    Alzheimer's disease Paternal Grandmother    Cirrhosis Paternal Grandfather    Heart disease Maternal Grandmother     No Known Allergies   REVIEW OF SYSTEMS (Negative unless checked)  Constitutional: '[]'$ Weight loss  '[]'$ Fever  '[]'$ Chills Cardiac: '[]'$ Chest pain   '[]'$ Chest pressure   '[]'$ Palpitations   '[]'$ Shortness of breath when laying flat   '[]'$ Shortness of breath with exertion. Vascular:  '[x]'$ Pain in legs with walking   '[]'$ Pain in legs at  rest  '[]'$ History of DVT   '[]'$ Phlebitis   '[]'$ Swelling in legs   '[]'$ Varicose veins   '[]'$ Non-healing ulcers Pulmonary:   '[]'$ Uses home oxygen   '[]'$ Productive cough   '[]'$ Hemoptysis   '[]'$ Wheeze  '[]'$ COPD   '[]'$ Asthma Neurologic:  '[]'$ Dizziness   '[]'$ Seizures   '[]'$ History of stroke   '[]'$ History of TIA  '[]'$ Aphasia   '[]'$ Vissual changes   '[]'$ Weakness or numbness in arm   '[]'$ Weakness or numbness in leg Musculoskeletal:   '[]'$ Joint swelling   '[]'$ Joint pain   '[]'$ Low back pain Hematologic:  '[]'$ Easy bruising  '[]'$ Easy bleeding   '[]'$ Hypercoagulable state   '[]'$ Anemic Gastrointestinal:  '[]'$ Diarrhea   '[]'$ Vomiting  '[]'$ Gastroesophageal reflux/heartburn   '[]'$ Difficulty swallowing. Genitourinary:  '[]'$ Chronic kidney disease   '[]'$ Difficult urination  '[]'$ Frequent urination   '[]'$ Blood in urine Skin:  '[]'$ Rashes   '[]'$ Ulcers   Psychological:  '[]'$ History of anxiety   '[]'$  History of major depression.  Physical Examination  Vitals:   08/05/22 1335  BP: 130/79  Pulse: 72  Resp: 16  Weight: 195 lb 6.4 oz (88.6 kg)  Height: '5\' 9"'$  (1.753 m)   Body mass index is 28.86 kg/m. Gen: WD/WN, NAD Head: [place]/AT, No temporalis wasting.  Ear/Nose/Throat: Hearing grossly intact, nares w/o erythema or drainage Eyes: PER, EOMI, sclera nonicteric.  Neck: Supple, no masses.  No bruit or JVD.  Pulmonary:  Good air movement, no audible wheezing, no use of accessory muscles.  Cardiac: RRR, normal S1, S2, no Murmurs. Vascular:  mild trophic changes, no open wounds Vessel Right Left  Radial Palpable Palpable  PT Not Palpable Not Palpable  DP Not Palpable Not Palpable  Gastrointestinal: soft, non-distended. No guarding/no peritoneal signs.  Musculoskeletal: M/S 5/5 throughout.  No visible deformity.  Neurologic: CN 2-12 intact. Pain and light touch intact in extremities.  Symmetrical.  Speech is fluent. Motor exam as listed above. Psychiatric: Judgment intact, Mood & affect appropriate for pt's clinical situation. Dermatologic: No rashes or ulcers noted.  No changes consistent with cellulitis.   CBC Lab Results  Component Value Date   WBC 5.7 05/20/2022   HGB 9.5 (L) 05/20/2022   HCT 27.2 (L) 05/20/2022   MCV 97.8 05/20/2022   PLT 64 (L) 05/20/2022    BMET    Component Value Date/Time   NA 140 05/20/2022 0356   NA 136 09/27/2019 1504   K 3.6 05/20/2022 0356   CL 107 05/20/2022 0356   CO2 26 05/20/2022 0356   GLUCOSE 152 (H) 05/20/2022 0356   BUN 37 (H) 05/20/2022 0356   BUN 8 09/27/2019 1504   CREATININE 1.58 (H) 05/20/2022 0356   CALCIUM 8.0 (L) 05/20/2022 0356   GFRNONAA 54 (L) 05/20/2022 0356   GFRAA >60 03/01/2020 1122   CrCl cannot be calculated (Patient's most recent lab result is older than the maximum 21 days allowed.).  COAG Lab Results  Component Value Date   INR 1.6 (H) 05/20/2022   INR 1.6 (H)  05/17/2022    Radiology No results found.   Assessment/Plan 1. Aneurysm of ascending aorta without rupture (HCC) Recommend:  No surgery or intervention is indicated at this time.  The patient has an asymptomatic thoracic aortic aneurysm that is less than 6.0 cm in maximal diameter.  I have discussed the natural history of thoracic aortic aneurysm and the small risk of rupture for aneurysm less than 6.5 cm in size.  However, as these small aneurysms tend to enlarge over time, continued surveillance with CT scan is mandatory.   I have also  discussed optimizing medical management with hypertension and lipid control and the importance of abstinence from tobacco.  The patient is also encouraged to exercise a minimum of 30 minutes 4 times a week.   Should the patient develop new onset chest or back pain or signs of peripheral embolization they are instructed to seek medical attention immediately and to alert the physician providing care that they have an aneurysm in the chest.   The patient voices their understanding.  The patient will return as ordered with a CT scan of the chest  - CT CHEST WO CONTRAST; Future  2. Cardiomyopathy, unspecified type ([place]) Continue cardiac and antihypertensive medications as already ordered and reviewed, no changes at this time.  Continue statin as ordered and reviewed, no changes at this time  Nitrates PRN for chest pain   3. Benign hypertension Continue antihypertensive medications as already ordered, these medications have been reviewed and there are no changes at this time  4. Diabetes mellitus type 2, noninsulin dependent ([place]) Continue hypoglycemic medications as already ordered, these medications have been reviewed and there are no changes at this time.  Hgb A1C to be monitored as already arranged by primary service   5. Mixed hyperlipidemia Continue statin as ordered and reviewed, no changes at this time     Hortencia Pilar,  MD  08/07/2022 11:15 AM

## 2022-08-08 LAB — CBC
Hematocrit: 35.8 % — ABNORMAL LOW (ref 37.5–51.0)
Hemoglobin: 12.2 g/dL — ABNORMAL LOW (ref 13.0–17.7)
MCH: 31.4 pg (ref 26.6–33.0)
MCHC: 34.1 g/dL (ref 31.5–35.7)
MCV: 92 fL (ref 79–97)
Platelets: 130 10*3/uL — ABNORMAL LOW (ref 150–450)
RBC: 3.89 x10E6/uL — ABNORMAL LOW (ref 4.14–5.80)
RDW: 11.5 % — ABNORMAL LOW (ref 11.6–15.4)
WBC: 9.3 10*3/uL (ref 3.4–10.8)

## 2022-08-08 LAB — HEPATIC FUNCTION PANEL
ALT: 14 IU/L (ref 0–44)
AST: 28 IU/L (ref 0–40)
Albumin: 3.6 g/dL — ABNORMAL LOW (ref 4.1–5.1)
Alkaline Phosphatase: 43 IU/L — ABNORMAL LOW (ref 44–121)
Bilirubin Total: 0.5 mg/dL (ref 0.0–1.2)
Bilirubin, Direct: 0.17 mg/dL (ref 0.00–0.40)
Total Protein: 7.4 g/dL (ref 6.0–8.5)

## 2022-08-08 LAB — PROTIME-INR
INR: 1.2 (ref 0.9–1.2)
Prothrombin Time: 13 s — ABNORMAL HIGH (ref 9.1–12.0)

## 2022-08-23 MED ORDER — CLENPIQ 10-3.5-12 MG-GM -GM/160ML PO SOLN: 1.0000 | Freq: Once | ORAL | 0 refills | Status: DC

## 2022-08-23 MED ORDER — PEG 3350-KCL-NA BICARB-NACL 420 G PO SOLR: 4000.0000 mL | Freq: Once | ORAL | 0 refills | Status: AC

## 2022-08-23 NOTE — Addendum Note (Signed)
Addended by: Lurlean Nanny on: 08/23/2022 09:50 AM   Modules accepted: Orders

## 2022-08-27 ENCOUNTER — Ambulatory Visit: Admission: RE | Admit: 2022-08-27 | Payer: Medicare HMO | Source: Ambulatory Visit

## 2022-09-06 ENCOUNTER — Ambulatory Visit: Payer: Medicare HMO

## 2022-09-06 ENCOUNTER — Telehealth (INDEPENDENT_AMBULATORY_CARE_PROVIDER_SITE_OTHER): Payer: Self-pay | Admitting: Vascular Surgery

## 2022-09-06 NOTE — Telephone Encounter (Signed)
In response to [facility] at clinic, I returned call to patient  regarding R/S of his CT and follow up with Dr. Delana Meyer. Pt's wife answered and stated that pt is going to call Radiology scheduling to R/S CT appt and as soon as that is scheduled, he will call back to the clinic to make a CT results appt with Dr. Delana Meyer. We will wait to receive phone call from patient and make CT results appt. See Dr. Delana Meyer.

## 2022-09-09 ENCOUNTER — Telehealth: Payer: Self-pay

## 2022-09-09 ENCOUNTER — Encounter: Payer: Self-pay | Admitting: Gastroenterology

## 2022-09-09 ENCOUNTER — Ambulatory Visit (INDEPENDENT_AMBULATORY_CARE_PROVIDER_SITE_OTHER): Payer: Medicare HMO | Admitting: Vascular Surgery

## 2022-09-09 NOTE — Telephone Encounter (Signed)
Received message from Cody [name] patient has COVID and has to reschedule his colonoscopy.  I attempted to call patient however no one answered.  Colonoscopy was scheduled with Dr. Allen Norris (Dr. Dorothey Baseman patient) for tomorrow at [facility].  Colonoscopy canceled.  Thanks,  [place], [place]

## 2022-09-10 ENCOUNTER — Encounter: Admission: RE | Payer: Self-pay | Source: Home / Self Care

## 2022-09-10 ENCOUNTER — Ambulatory Visit: Admission: RE | Admit: 2022-09-10 | Payer: Medicare HMO | Source: Home / Self Care | Admitting: Gastroenterology

## 2022-09-10 HISTORY — DX: Type 2 diabetes mellitus without complications: E11.9

## 2022-09-10 SURGERY — COLONOSCOPY WITH PROPOFOL
Anesthesia: General

## 2022-09-16 ENCOUNTER — Encounter (INDEPENDENT_AMBULATORY_CARE_PROVIDER_SITE_OTHER): Payer: Self-pay

## 2022-09-24 DIAGNOSIS — Z950 Presence of cardiac pacemaker: Secondary | ICD-10-CM | POA: Diagnosis not present

## 2022-11-27 DIAGNOSIS — M7501 Adhesive capsulitis of right shoulder: Secondary | ICD-10-CM | POA: Diagnosis not present

## 2022-11-27 DIAGNOSIS — M7542 Impingement syndrome of left shoulder: Secondary | ICD-10-CM | POA: Diagnosis not present

## 2022-11-27 DIAGNOSIS — M7541 Impingement syndrome of right shoulder: Secondary | ICD-10-CM | POA: Diagnosis not present

## 2022-12-18 ENCOUNTER — Telehealth: Payer: Self-pay

## 2022-12-18 DIAGNOSIS — R748 Abnormal levels of other serum enzymes: Secondary | ICD-10-CM

## 2022-12-18 DIAGNOSIS — K760 Fatty (change of) liver, not elsewhere classified: Secondary | ICD-10-CM

## 2022-12-18 NOTE — Telephone Encounter (Signed)
-----  Message from Lurlean Nanny, [place] sent at 08/12/2022 11:55 AM EDT ----- Regarding: 6 mth [place] 6 mth [place]

## 2022-12-18 NOTE — Telephone Encounter (Signed)
Called pt, no answer and no VM

## 2022-12-19 DIAGNOSIS — E119 Type 2 diabetes mellitus without complications: Secondary | ICD-10-CM | POA: Diagnosis not present

## 2022-12-19 DIAGNOSIS — I5032 Chronic diastolic (congestive) heart failure: Secondary | ICD-10-CM | POA: Diagnosis not present

## 2022-12-19 DIAGNOSIS — E7800 Pure hypercholesterolemia, unspecified: Secondary | ICD-10-CM | POA: Diagnosis not present

## 2022-12-19 DIAGNOSIS — I77810 Thoracic aortic ectasia: Secondary | ICD-10-CM | POA: Diagnosis not present

## 2022-12-19 DIAGNOSIS — I447 Left bundle-branch block, unspecified: Secondary | ICD-10-CM | POA: Diagnosis not present

## 2022-12-19 DIAGNOSIS — I719 Aortic aneurysm of unspecified site, without rupture: Secondary | ICD-10-CM | POA: Diagnosis not present

## 2022-12-19 DIAGNOSIS — Z950 Presence of cardiac pacemaker: Secondary | ICD-10-CM | POA: Diagnosis not present

## 2022-12-19 DIAGNOSIS — I428 Other cardiomyopathies: Secondary | ICD-10-CM | POA: Diagnosis not present

## 2022-12-19 DIAGNOSIS — I959 Hypotension, unspecified: Secondary | ICD-10-CM | POA: Diagnosis not present

## 2022-12-24 ENCOUNTER — Other Ambulatory Visit: Payer: Self-pay | Admitting: Internal Medicine

## 2022-12-24 DIAGNOSIS — I719 Aortic aneurysm of unspecified site, without rupture: Secondary | ICD-10-CM

## 2022-12-30 NOTE — Telephone Encounter (Signed)
Left message on voicemail.

## 2022-12-31 ENCOUNTER — Ambulatory Visit
Admission: RE | Admit: 2022-12-31 | Discharge: 2022-12-31 | Disposition: A | Discharge status: Home / Self Care | Payer: Medicare HMO | Source: Ambulatory Visit | Attending: Internal Medicine | Admitting: Internal Medicine

## 2022-12-31 DIAGNOSIS — I719 Aortic aneurysm of unspecified site, without rupture: Secondary | ICD-10-CM | POA: Insufficient documentation

## 2022-12-31 DIAGNOSIS — I7121 Aneurysm of the ascending aorta, without rupture: Secondary | ICD-10-CM | POA: Diagnosis not present

## 2022-12-31 MED ORDER — IOHEXOL 350 MG/ML SOLN
75.0000 mL | Freq: Once | INTRAVENOUS | Status: AC | PRN
  Administered 2022-12-31: 75 mL via INTRAVENOUS

## 2023-01-08 NOTE — Addendum Note (Signed)
Addended by: Lurlean Nanny on: 01/08/2023 10:31 AM   Modules accepted: Orders

## 2023-01-08 NOTE — Telephone Encounter (Signed)
Letter mailed to pt letting him know he is overdue for repeat US and [place] ordered

## 2023-02-05 ENCOUNTER — Other Ambulatory Visit: Payer: Self-pay

## 2023-02-05 MED ORDER — THIAMINE HCL 100 MG PO TABS: 100.0000 mg | ORAL_TABLET | Freq: Every day | ORAL | 1 refills | Status: AC

## 2023-02-05 MED ORDER — FOLIC ACID 1 MG PO TABS: 1.0000 mg | ORAL_TABLET | Freq: Every day | ORAL | 1 refills | Status: DC

## 2023-02-05 NOTE — Telephone Encounter (Signed)
Pt's spouse Angie called requesting if pt should continue Thiamine and Folic acid that was prescribed from ER? If so, would you be the one to refill them? Please advise

## 2023-02-10 ENCOUNTER — Ambulatory Visit
Admission: RE | Admit: 2023-02-10 | Discharge: 2023-02-10 | Disposition: A | Discharge status: Home / Self Care | Payer: Medicare HMO | Source: Ambulatory Visit | Attending: Gastroenterology | Admitting: Gastroenterology

## 2023-02-10 DIAGNOSIS — K760 Fatty (change of) liver, not elsewhere classified: Secondary | ICD-10-CM | POA: Diagnosis not present

## 2023-02-10 DIAGNOSIS — R748 Abnormal levels of other serum enzymes: Secondary | ICD-10-CM | POA: Diagnosis not present

## 2023-02-10 DIAGNOSIS — R945 Abnormal results of liver function studies: Secondary | ICD-10-CM | POA: Diagnosis not present

## 2023-02-10 DIAGNOSIS — K824 Cholesterolosis of gallbladder: Secondary | ICD-10-CM | POA: Diagnosis not present

## 2023-02-13 ENCOUNTER — Telehealth: Payer: Self-pay | Admitting: Gastroenterology

## 2023-02-13 NOTE — Telephone Encounter (Signed)
Pt would like a call back with results of his liver biopsy

## 2023-08-08 ENCOUNTER — Other Ambulatory Visit: Payer: Self-pay | Admitting: Gastroenterology

## 2023-09-02 DIAGNOSIS — I5022 Chronic systolic (congestive) heart failure: Secondary | ICD-10-CM | POA: Diagnosis not present

## 2023-09-26 DIAGNOSIS — I5032 Chronic diastolic (congestive) heart failure: Secondary | ICD-10-CM | POA: Diagnosis not present

## 2023-09-26 DIAGNOSIS — E119 Type 2 diabetes mellitus without complications: Secondary | ICD-10-CM | POA: Diagnosis not present

## 2023-09-26 DIAGNOSIS — I5022 Chronic systolic (congestive) heart failure: Secondary | ICD-10-CM | POA: Diagnosis not present

## 2023-09-26 DIAGNOSIS — I428 Other cardiomyopathies: Secondary | ICD-10-CM | POA: Diagnosis not present

## 2023-09-26 DIAGNOSIS — I959 Hypotension, unspecified: Secondary | ICD-10-CM | POA: Diagnosis not present

## 2023-09-26 DIAGNOSIS — I447 Left bundle-branch block, unspecified: Secondary | ICD-10-CM | POA: Diagnosis not present

## 2023-09-26 DIAGNOSIS — Z950 Presence of cardiac pacemaker: Secondary | ICD-10-CM | POA: Diagnosis not present

## 2023-09-26 DIAGNOSIS — G4733 Obstructive sleep apnea (adult) (pediatric): Secondary | ICD-10-CM | POA: Diagnosis not present

## 2023-09-26 DIAGNOSIS — I77810 Thoracic aortic ectasia: Secondary | ICD-10-CM | POA: Diagnosis not present

## 2024-01-05 DIAGNOSIS — L97522 Non-pressure chronic ulcer of other part of left foot with fat layer exposed: Secondary | ICD-10-CM | POA: Diagnosis not present

## 2024-01-05 DIAGNOSIS — E119 Type 2 diabetes mellitus without complications: Secondary | ICD-10-CM | POA: Diagnosis not present

## 2024-01-07 ENCOUNTER — Other Ambulatory Visit: Payer: Self-pay | Admitting: Podiatry

## 2024-01-07 DIAGNOSIS — E119 Type 2 diabetes mellitus without complications: Secondary | ICD-10-CM

## 2024-01-07 DIAGNOSIS — L97522 Non-pressure chronic ulcer of other part of left foot with fat layer exposed: Secondary | ICD-10-CM

## 2024-04-02 ENCOUNTER — Other Ambulatory Visit
Admission: RE | Admit: 2024-04-02 | Discharge: 2024-04-02 | Disposition: A | Discharge status: Home / Self Care | Source: Ambulatory Visit | Attending: Internal Medicine | Admitting: Internal Medicine

## 2024-04-02 DIAGNOSIS — I4891 Unspecified atrial fibrillation: Secondary | ICD-10-CM | POA: Diagnosis present

## 2024-04-02 LAB — DIGOXIN LEVEL: Digoxin Level: 0.8 ng/mL (ref 0.8–2.0)
